# Patient Record
Sex: Female | Born: 1940
Health system: Southern US, Community
[De-identification: ages and names within clinical notes are randomized; demographics above are authoritative.]

## PROBLEM LIST (undated history)

## (undated) DIAGNOSIS — E079 Disorder of thyroid, unspecified: Secondary | ICD-10-CM

## (undated) DIAGNOSIS — I10 Essential (primary) hypertension: Secondary | ICD-10-CM

## (undated) DIAGNOSIS — E785 Hyperlipidemia, unspecified: Secondary | ICD-10-CM

## (undated) HISTORY — PX: APPENDECTOMY: SHX54

## (undated) HISTORY — DX: Hyperlipidemia, unspecified: E78.5

## (undated) HISTORY — PX: ABDOMINAL HYSTERECTOMY: SHX81

## (undated) HISTORY — PX: OTHER SURGICAL HISTORY: SHX169

## (undated) HISTORY — DX: Disorder of thyroid, unspecified: E07.9

## (undated) HISTORY — DX: Essential (primary) hypertension: I10

---

## 1999-11-17 ENCOUNTER — Other Ambulatory Visit: Admission: RE | Admit: 1999-11-17 | Discharge: 1999-11-17 | Payer: Self-pay | Admitting: Family Medicine

## 2001-04-11 ENCOUNTER — Encounter: Admission: RE | Admit: 2001-04-11 | Discharge: 2001-04-11 | Payer: Self-pay | Admitting: Family Medicine

## 2001-04-11 ENCOUNTER — Encounter: Payer: Self-pay | Admitting: Family Medicine

## 2002-01-09 ENCOUNTER — Other Ambulatory Visit: Admission: RE | Admit: 2002-01-09 | Discharge: 2002-01-09 | Payer: Self-pay | Admitting: Family Medicine

## 2002-02-04 ENCOUNTER — Encounter: Payer: Self-pay | Admitting: Ophthalmology

## 2002-02-04 ENCOUNTER — Ambulatory Visit (HOSPITAL_COMMUNITY): Admission: RE | Admit: 2002-02-04 | Discharge: 2002-02-04 | Payer: Self-pay | Admitting: Ophthalmology

## 2002-04-12 ENCOUNTER — Encounter: Payer: Self-pay | Admitting: Family Medicine

## 2002-04-12 ENCOUNTER — Encounter: Admission: RE | Admit: 2002-04-12 | Discharge: 2002-04-12 | Payer: Self-pay | Admitting: Family Medicine

## 2003-04-21 ENCOUNTER — Encounter: Admission: RE | Admit: 2003-04-21 | Discharge: 2003-04-21 | Payer: Self-pay | Admitting: Family Medicine

## 2003-04-21 ENCOUNTER — Encounter: Payer: Self-pay | Admitting: Family Medicine

## 2004-01-29 ENCOUNTER — Other Ambulatory Visit: Admission: RE | Admit: 2004-01-29 | Discharge: 2004-01-29 | Payer: Self-pay | Admitting: Family Medicine

## 2004-04-28 ENCOUNTER — Encounter: Admission: RE | Admit: 2004-04-28 | Discharge: 2004-04-28 | Payer: Self-pay | Admitting: Family Medicine

## 2005-05-11 ENCOUNTER — Encounter: Admission: RE | Admit: 2005-05-11 | Discharge: 2005-05-11 | Payer: Self-pay | Admitting: Family Medicine

## 2006-05-12 ENCOUNTER — Encounter: Admission: RE | Admit: 2006-05-12 | Discharge: 2006-05-12 | Payer: Self-pay | Admitting: Family Medicine

## 2007-01-22 ENCOUNTER — Ambulatory Visit: Payer: Self-pay | Admitting: Cardiology

## 2007-02-02 ENCOUNTER — Ambulatory Visit: Payer: Self-pay | Admitting: Internal Medicine

## 2007-02-02 LAB — CONVERTED CEMR LAB
BUN: 13 mg/dL (ref 6–23)
GFR calc non Af Amer: 89 mL/min
Glucose, Bld: 112 mg/dL — ABNORMAL HIGH (ref 70–99)
Potassium: 4.3 meq/L (ref 3.5–5.1)
Sodium: 140 meq/L (ref 135–145)

## 2007-02-08 ENCOUNTER — Ambulatory Visit (HOSPITAL_COMMUNITY): Admission: RE | Admit: 2007-02-08 | Discharge: 2007-02-08 | Payer: Self-pay | Admitting: Internal Medicine

## 2007-02-08 ENCOUNTER — Ambulatory Visit: Payer: Self-pay | Admitting: Cardiovascular Disease

## 2007-04-17 ENCOUNTER — Other Ambulatory Visit: Admission: RE | Admit: 2007-04-17 | Discharge: 2007-04-17 | Payer: Self-pay | Admitting: Family Medicine

## 2007-05-17 ENCOUNTER — Encounter: Admission: RE | Admit: 2007-05-17 | Discharge: 2007-05-17 | Payer: Self-pay | Admitting: Family Medicine

## 2008-04-22 ENCOUNTER — Encounter: Payer: Self-pay | Admitting: Emergency Medicine

## 2008-05-16 ENCOUNTER — Ambulatory Visit: Payer: Self-pay | Admitting: Internal Medicine

## 2008-05-20 ENCOUNTER — Encounter: Admission: RE | Admit: 2008-05-20 | Discharge: 2008-05-20 | Payer: Self-pay | Admitting: Family Medicine

## 2008-11-21 ENCOUNTER — Ambulatory Visit: Payer: Self-pay | Admitting: Emergency Medicine

## 2008-11-21 DIAGNOSIS — J309 Allergic rhinitis, unspecified: Secondary | ICD-10-CM | POA: Insufficient documentation

## 2008-11-21 DIAGNOSIS — E039 Hypothyroidism, unspecified: Secondary | ICD-10-CM | POA: Insufficient documentation

## 2008-11-21 DIAGNOSIS — I10 Essential (primary) hypertension: Secondary | ICD-10-CM | POA: Insufficient documentation

## 2008-11-21 DIAGNOSIS — R930 Abnormal findings on diagnostic imaging of skull and head, not elsewhere classified: Secondary | ICD-10-CM | POA: Insufficient documentation

## 2009-02-25 ENCOUNTER — Encounter (INDEPENDENT_AMBULATORY_CARE_PROVIDER_SITE_OTHER): Payer: Self-pay | Admitting: *Deleted

## 2009-05-28 ENCOUNTER — Encounter: Admission: RE | Admit: 2009-05-28 | Discharge: 2009-05-28 | Payer: Self-pay | Admitting: Family Medicine

## 2009-06-02 ENCOUNTER — Encounter: Admission: RE | Admit: 2009-06-02 | Discharge: 2009-06-02 | Payer: Self-pay | Admitting: Family Medicine

## 2010-01-26 ENCOUNTER — Telehealth: Payer: Self-pay | Admitting: Internal Medicine

## 2010-06-08 ENCOUNTER — Encounter: Admission: RE | Admit: 2010-06-08 | Discharge: 2010-06-08 | Payer: Self-pay | Admitting: Family Medicine

## 2011-01-13 NOTE — Progress Notes (Signed)
Summary: Change Practices   Phone Note Outgoing Call Call back at [facility] Phone [phone]   Call placed by: Harlow Mares CMA Duncan Dull),  January 26, 2010 9:26 AM Call placed to: Patient Summary of Call: patient called to advised she needs to schedule colonoscopy she has transfered practices to a GI in [place]. Lady Gary will put a note in IDX.  Initial call taken by: Harlow Mares CMA (AAMA),  January 26, 2010 9:26 AM

## 2011-03-07 ENCOUNTER — Ambulatory Visit: Payer: Medicare Other | Attending: Orthopaedic Surgery | Admitting: Physical Therapy

## 2011-03-07 DIAGNOSIS — IMO0001 Reserved for inherently not codable concepts without codable children: Secondary | ICD-10-CM | POA: Insufficient documentation

## 2011-03-07 DIAGNOSIS — R5381 Other malaise: Secondary | ICD-10-CM | POA: Insufficient documentation

## 2011-03-07 DIAGNOSIS — M25619 Stiffness of unspecified shoulder, not elsewhere classified: Secondary | ICD-10-CM | POA: Insufficient documentation

## 2011-03-07 DIAGNOSIS — M25519 Pain in unspecified shoulder: Secondary | ICD-10-CM | POA: Insufficient documentation

## 2011-03-11 ENCOUNTER — Ambulatory Visit: Payer: Medicare Other | Attending: Orthopaedic Surgery | Admitting: Physical Therapy

## 2011-03-11 DIAGNOSIS — R5381 Other malaise: Secondary | ICD-10-CM | POA: Insufficient documentation

## 2011-03-11 DIAGNOSIS — IMO0001 Reserved for inherently not codable concepts without codable children: Secondary | ICD-10-CM | POA: Insufficient documentation

## 2011-03-11 DIAGNOSIS — M25619 Stiffness of unspecified shoulder, not elsewhere classified: Secondary | ICD-10-CM | POA: Insufficient documentation

## 2011-03-11 DIAGNOSIS — M25519 Pain in unspecified shoulder: Secondary | ICD-10-CM | POA: Insufficient documentation

## 2011-03-14 ENCOUNTER — Ambulatory Visit: Payer: Medicare Other | Attending: Orthopaedic Surgery | Admitting: Physical Therapy

## 2011-03-14 DIAGNOSIS — R5381 Other malaise: Secondary | ICD-10-CM | POA: Insufficient documentation

## 2011-03-14 DIAGNOSIS — IMO0001 Reserved for inherently not codable concepts without codable children: Secondary | ICD-10-CM | POA: Insufficient documentation

## 2011-03-14 DIAGNOSIS — M25519 Pain in unspecified shoulder: Secondary | ICD-10-CM | POA: Insufficient documentation

## 2011-03-14 DIAGNOSIS — M25619 Stiffness of unspecified shoulder, not elsewhere classified: Secondary | ICD-10-CM | POA: Insufficient documentation

## 2011-03-25 ENCOUNTER — Ambulatory Visit: Payer: Medicare Other | Admitting: Physical Therapy

## 2011-03-29 ENCOUNTER — Ambulatory Visit: Payer: Medicare Other | Admitting: Physical Therapy

## 2011-04-01 ENCOUNTER — Ambulatory Visit: Payer: Medicare Other | Admitting: *Deleted

## 2011-04-26 NOTE — Assessment & Plan Note (Signed)
[facility]                            CARDIOLOGY OFFICE NOTE   ROSEBUD, KOENEN                        MRN:          [id number]  DATE:05/16/2008                            DOB:          04-04-1941    PRIMARY CARE Cataleah Stites:  [facility].   INTERVAL HISTORY:  Ms. Clagg is a 70 year old retired occupational  Geneticist, molecular with a history of PVCs.  There is also some question of  previous mitral valve prolapse and coronary fistula; however, she had a  cardiac CT that was normal.  There is no evidence of either.   She returns today for routine followup.  She is walking every day for 40  minutes without any problems.  Her PVCs have been eliminated with  atenolol.   The only question today is she recently had a routine chest x-ray, which  showed some apical pleural thickening.  This apparently was not evident  on her CT a year ago.  She is concerned about what this might represent.  She has not had any fevers, chills, or night sweats.   CURRENT MEDICATIONS:  1. Atenolol 50 a day.  2. Synthroid 100 a day.  3. Multivitamin.  4. Fish oil.   PHYSICAL EXAMINATION:  She is a thin woman in no acute distress,  ambulates around the clinic without any respiratory difficulty.  Blood  pressure is 120/70, heart rate is 66, weight is 132.  HEENT:  Normal.  NECK:  Supple.  There is no JVD.  Carotid are 2+ bilaterally without  bruits.  There is no lymphadenopathy or thyromegaly.  CARDIAC:  PMI is  nondisplaced, regular rate and rhythm.  No murmurs, rubs, or gallops.  No clicks.  LUNGS:  Clear.  ABDOMEN:  Soft, nontender, nondistended.  No hepatosplenomegaly.  No  bruits.  No masses.  Good bowel sounds.  EXTREMITIES: Warm with no  cyanosis, clubbing, or edema.  No rash.  NEURO:  Alert and oriented x3.  Cranial nerves II-XII are intact.  Moves  all 4 extremities without difficulty.  Affect is pleasant.   EKG shows normal sinus rhythm,  rate is 66 with no ST-T wave  abnormalities.  No extra beats.   ASSESSMENT/PLAN:  1. Premature ventricular contractions.  These have resolved with      atenolol.  2. Abnormal chest x-ray with possible apical pleural thickening.  I      have given her the option of getting a repeat CT scan or going to      see Dr. Delton Coombes in Pulmonary.  She will go and see Dr. Delton Coombes.  I      suspect this is benign.   DISPOSITION:  She is doing well.  No need for ongoing cardiac workup.  She can return as needed.     Bevelyn Buckles. Bensimhon, MD  Electronically Signed    DRB/MedQ  DD: 05/16/2008  DT: 05/16/2008  Job #: [id number]

## 2011-04-29 NOTE — Assessment & Plan Note (Signed)
[name]                            CARDIOLOGY OFFICE NOTE   [name], [name]                        MRN:          [id number]  DATE:02/02/2007                            DOB:          May 10, 1941    REFERRING PHYSICIAN:  Alfredia Client, MD   REASON FOR CONSULTATION:  Abnormal echocardiogram.   HISTORY OF PRESENT ILLNESS:  [name] is a delightful 70 year old  retired occupational Geneticist, molecular with a history of premature  ventricular beats, but no other known cardiac history, who presents  today for further evaluation of an abnormal echocardiogram.  She tells  me that, about 15 years ago, she was seen in our office for palpations.  She underwent a stress test, which was negative, and an echocardiogram  for which she was told that she had mitral valve prolapse.  She has also  been seen recently by Dr. Antoine Poche for frequent PVCs.  She has been  maintained on a beta blocker.  Recently, her dentist told her that she  would no longer need antibiotic prophylaxis regarding her mitral valve  prolapse.  Her primary physician ordered an echocardiogram to follow up  on her valve, and the echocardiogram was read by Dr. Andee Lineman, showed an  EF of 60%.  There was no evidence of significant mitral valve prolapse  or other valvular pathology.  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  She is, thus, referred for  consideration for further workup.  She is quite active at baseline.  She  walks up to 2-and-a-half miles several times a week, walks in 40  minutes.  However, she notes when she goes uphill, she does get a bit  short of breath with this.  She denies any chest pain.  No heart failure  symptoms.   REVIEW OF SYSTEMS:  Negative for syncope or pre-syncope.  She does have  a history of high blood pressure.  No melena, no fevers, no chills, no  bright red blood per rectum.  She does have a history of allergies as  well.  The remainder of the review of systems is negative, except for  HPI and problem list.   PAST MEDICAL HISTORY:  1. History of premature ventricular contractions.  2. Hypertension, treated.  3. Hypothyroidism.  4. Allergies/hay fever.   CURRENT MEDICATIONS:  1. Atenolol 15 mg a day.  2. Synthroid 100 mcg a day.  3. Multivitamin.   SOCIAL HISTORY:  She is a retired Conservation officer, nature.  She is married  with 3 kids.  She does not smoke.  She does have 1 glass of red wine a  day.   ALLERGIES:  PENICILLIN, SULFA.   FAMILY HISTORY:  Mother lived to almost 49.  Died of a heart attack.  Father died at 70 due to pneumonia.  A brother died at age 56 due to  diphtheria.   PHYSICAL EXAM:  She is a well-appearing woman in no acute distress.  Ambulates around the clinic without any respiratory difficulty.  Blood pressure is 132/88, heart rate is 74, weight  129.  HEENT:  Sclerae anicteric.  EOMI.  There is no xanthelasma.  Mucous  membranes are moist.  Oropharynx is clear.  No JVD.  Carotid are 2+ bilaterally without any bruits.  There is no  lymphadenopathy or thyromegaly.  CARDIAC:  She has a regular rate and rhythm with an S4.  No murmur.  There is no bruit over her subclavians or over her back.  LUNGS:  Clear.  ABDOMEN:  Soft, nontender, nondistended.  No hepatosplenomegaly.  No  bruits.  No masses.  Good bowel sounds.  EXTREMITIES:  Warm with no cyanosis, clubbing, or edema.  No rash.  NEUROLOGIC:  She is alert and oriented x3.  Cranial nerves 2-12 are  intact.  Moves all 4 extremities without difficulty.  Affect is  pleasant.   ELECTROCARDIOGRAM:  Normal sinus rhythm at a rate of 68 with no ST-T  wave abnormalities.  There is no preexcitation and there are normal  intervals.   ASSESSMENT AND PLAN:  1. Abnormal echocardiogram.  According to the report form her      echocardiogram, there is some question of an abnormal flow      originating from the pulmonary artery, perhaps  coronary fistula.      There is even some comment of a possible patent ductus arteriosus.      However, on my exam there is no bruit there.  I do think it is      reasonable to proceed with a cardiac CT to further evaluate.  2. Dyspnea.  Her exercise tolerance is pretty good.  She does have      some dyspnea on hills.  I think this is normal.  She may have some      component of diastolic dyspnea, though this was not mentioned on      her echocardiogram.  We will be able to look at her coronaries with      the cardiac CT.  3. Hypertension.  Blood pressure is mildly elevated.  I will leave      this to her primary care physician to address.   DISPOSITION:  Pending the results of her cardiac CT.     Bevelyn Buckles. Bensimhon, MD  Electronically Signed    DRB/MedQ  DD: 02/02/2007  DT: 02/02/2007  Job #: [id number]

## 2011-05-20 ENCOUNTER — Other Ambulatory Visit: Payer: Self-pay | Admitting: Family Medicine

## 2011-05-20 DIAGNOSIS — Z1231 Encounter for screening mammogram for malignant neoplasm of breast: Secondary | ICD-10-CM

## 2011-06-28 ENCOUNTER — Ambulatory Visit
Admission: RE | Admit: 2011-06-28 | Discharge: 2011-06-28 | Disposition: A | Discharge status: Home / Self Care | Payer: Medicare Other | Source: Ambulatory Visit | Attending: Family Medicine | Admitting: Family Medicine

## 2011-06-28 DIAGNOSIS — Z1231 Encounter for screening mammogram for malignant neoplasm of breast: Secondary | ICD-10-CM

## 2012-05-22 ENCOUNTER — Other Ambulatory Visit: Payer: Self-pay | Admitting: Family Medicine

## 2012-05-22 DIAGNOSIS — Z1231 Encounter for screening mammogram for malignant neoplasm of breast: Secondary | ICD-10-CM

## 2012-06-28 ENCOUNTER — Ambulatory Visit
Admission: RE | Admit: 2012-06-28 | Discharge: 2012-06-28 | Disposition: A | Discharge status: Home / Self Care | Payer: Medicare Other | Source: Ambulatory Visit | Attending: Family Medicine | Admitting: Family Medicine

## 2012-06-28 ENCOUNTER — Ambulatory Visit: Payer: Medicare Other

## 2012-06-28 DIAGNOSIS — Z1231 Encounter for screening mammogram for malignant neoplasm of breast: Secondary | ICD-10-CM

## 2013-05-16 ENCOUNTER — Telehealth: Payer: Self-pay | Admitting: Family Medicine

## 2013-06-11 ENCOUNTER — Other Ambulatory Visit: Payer: Self-pay

## 2013-06-11 DIAGNOSIS — Z1231 Encounter for screening mammogram for malignant neoplasm of breast: Secondary | ICD-10-CM

## 2013-07-04 ENCOUNTER — Ambulatory Visit (INDEPENDENT_AMBULATORY_CARE_PROVIDER_SITE_OTHER): Payer: Medicare Other | Admitting: Family Medicine

## 2013-07-04 ENCOUNTER — Encounter: Payer: Self-pay | Admitting: Family Medicine

## 2013-07-04 VITALS — BP 145/72 | HR 70 | Temp 97.3°F | Ht 64.0 in | Wt 132.0 lb

## 2013-07-04 DIAGNOSIS — I10 Essential (primary) hypertension: Secondary | ICD-10-CM

## 2013-07-04 DIAGNOSIS — Z0000 Encounter for general adult medical examination without abnormal findings: Secondary | ICD-10-CM | POA: Insufficient documentation

## 2013-07-04 DIAGNOSIS — G4700 Insomnia, unspecified: Secondary | ICD-10-CM

## 2013-07-04 DIAGNOSIS — E039 Hypothyroidism, unspecified: Secondary | ICD-10-CM

## 2013-07-04 DIAGNOSIS — E785 Hyperlipidemia, unspecified: Secondary | ICD-10-CM

## 2013-07-04 LAB — POCT CBC
Granulocyte percent: 72.1 %G (ref 37–80)
HCT, POC: 45.6 % (ref 37.7–47.9)
Hemoglobin: 15.6 g/dL (ref 12.2–16.2)
Lymph, poc: 1.9 (ref 0.6–3.4)
MCH, POC: 31 pg (ref 27–31.2)
MCHC: 34.2 g/dL (ref 31.8–35.4)
MCV: 90.7 fL (ref 80–97)
MPV: 6.7 fL (ref 0–99.8)
POC Granulocyte: 5.6 (ref 2–6.9)
POC LYMPH PERCENT: 25 %L (ref 10–50)
Platelet Count, POC: 241 10*3/uL (ref 142–424)
RBC: 5 M/uL (ref 4.04–5.48)
RDW, POC: 13 %
WBC: 7.7 10*3/uL (ref 4.6–10.2)

## 2013-07-04 MED ORDER — ATENOLOL 50 MG PO TABS: 50.0000 mg | ORAL_TABLET | Freq: Every day | ORAL | Status: DC

## 2013-07-04 MED ORDER — LEVOTHYROXINE SODIUM 100 MCG PO TABS: 100.0000 ug | ORAL_TABLET | Freq: Every day | ORAL | Status: DC

## 2013-07-04 MED ORDER — ESZOPICLONE 2 MG PO TABS: 2.0000 mg | ORAL_TABLET | Freq: Every day | ORAL | Status: DC

## 2013-07-04 NOTE — Patient Instructions (Addendum)
HEALTH MAINTENANCE Immunizations: Tetanus-Diphtheria Booster due:2016 Pertusis Booster due:2016 Flu Shot Due: every Fall Pneumonia Vaccine: usually at 73 years of age unless there are certain risk situations. given 05/2009 Herpes Zoster/Shingles Vaccine due: usually at 73 years of age. Given 05/25/2010 HPV YQM:VHQI age 58 to 31 years in males and females.  Healthy Life Habits: Exercise Goal: 5-6 days/week; start gradually(ie 30 minutes/3days per week) Nutrition: Balanced healthy meals including Vegetables and Fruits. Consider  Reading the following books: 1) Eat to Live by Dr Ottis Stain; 2) Prevent and Reverse Heart Disease by Dr Suzzette Righter.  Vitamins:multivitamin and B12 tabs is good Aspirin:n/a due to easy bleeding Stop Tobacco Use:n/a Seat Belt Use:+++ recommended Sunscreen Use:+++ recommended Osteoporosis Prevention: 1) Exercise                                            2)Calcium/Vitamin D requirements:he [employer] of [employer] recommends:    Calcium:  800 mg/day for children 84-61 years of age          73 mg/day for children 59-27 years of age          73 mg/day for adults 37-34 years of age          73 mg/day for everyone more than 73 years of age     Vitamin D: 800 IU per day or as prescribed if you are deficient.  Recommended Screening Tests: Colon Cancer Screening:2020 Blood work: today Cholesterol Screening:     +++      HIV:   n/a                Hepatitis C(people born 1945-1965):n/a  Mammogram:next Monday DEXA/Bone Density:2017 GYN Exam:today Monthly Self Breast Exam:+++  Eye Exam: every 1 to 2 years recommended; done in January. Dental Health: at least every 6 months: Dr Melvyn Neth  Others:    Living Will/Healthcare Power of Attorney: should have this in order with your personal estate planning

## 2013-07-04 NOTE — Progress Notes (Signed)
Patient ID: [name], female   DOB: 06-04-1941, 72 y.o.   MRN: [id number] SUBJECTIVE: CC: Chief Complaint  Patient presents with  . Annual Exam    NO COMPLAINTS    HPI: 1) annual physical: doing well  2) Patient is here for follow up of hyperlipidemia: denies Headache;denies Chest Pain;denies weakness;denies Shortness of Breath and orthopnea;denies Visual changes;denies palpitations;denies cough;denies pedal edema;denies symptoms of TIA or stroke;deniesClaudication symptoms. admits to Compliance with medications; Problems with medications.fenofibrate caused diarrhea; statin caused problems with shoulder pains.    3)Patient is here for follow up of hypertension: BPs are normal at home , patient brought readings: 110/70 to 120/70 denies Headache;deniesChest Pain;denies weakness;denies Shortness of Breath or Orthopnea;denies Visual changes;denies palpitations;denies cough;denies pedal edema;denies symptoms of TIA or stroke; admits to Compliance with medications. denies Problems with medications.  Past Medical History  Diagnosis Date  . Thyroid disease   . Hypertension   . Hyperlipidemia    Past Surgical History  Procedure Laterality Date  . Abdominal hysterectomy    . Appendectomy    . Tosillectomy     History   Social History  . Marital Status: Married    Spouse Name: N/A    Number of Children: N/A  . Years of Education: N/A   Occupational History  . Not on file.   Social History Main Topics  . Smoking status: Never Smoker   . Smokeless tobacco: Not on file  . Alcohol Use: Not on file  . Drug Use: Not on file  . Sexually Active: Not on file   Other Topics Concern  . Not on file   Social History Narrative  . No narrative on file   No family history on file. No current outpatient prescriptions on file prior to visit.   No current facility-administered medications on file prior to visit.   Allergies  Allergen Reactions  . Penicillins   . Sulfonamide  Derivatives     There is no immunization history on file for this patient. Prior to Admission medications   Medication Sig Start Date End Date Taking? Authorizing Provider  atenolol (TENORMIN) 50 MG tablet Take 1 tablet (50 mg total) by mouth daily. 07/04/13  Yes Ileana Ladd, MD  Cholecalciferol (VITAMIN D-3) 1000 UNITS CAPS Take 1 capsule by mouth daily.   Yes Historical Provider, MD  Coenzyme Q10 (CO Q 10) 100 MG CAPS Take 1 capsule by mouth daily.   Yes Historical Provider, MD  eszopiclone (LUNESTA) 2 MG TABS Take 1 tablet (2 mg total) by mouth at bedtime. Take immediately before bedtime 07/04/13  Yes Ileana Ladd, MD  fish oil-omega-3 fatty acids 1000 MG capsule Take 1 g by mouth.   Yes Historical Provider, MD  Glucosamine HCl 1000 MG TABS Take 1 tablet by mouth daily.   Yes Historical Provider, MD  levothyroxine (SYNTHROID, LEVOTHROID) 100 MCG tablet Take 1 tablet (100 mcg total) by mouth daily before breakfast. 07/04/13  Yes Ileana Ladd, MD  multivitamin-lutein [facility]) CAPS Take 1 capsule by mouth daily.   Yes Historical Provider, MD  Probiotic Product (SOLUBLE FIBER/PROBIOTICS PO) Take by mouth.   Yes Historical Provider, MD  vitamin E 400 UNIT capsule Take 400 Units by mouth daily.   Yes Historical Provider, MD   ROS: As above in the HPI. All other systems are stable or negative.  Results for orders placed in visit on 07/04/13  POCT CBC      Result Value Range   WBC 7.7  4.6 -  10.2 K/uL   Lymph, poc 1.9  0.6 - 3.4   POC LYMPH PERCENT 25.0  10 - 50 %L   POC Granulocyte 5.6  2 - 6.9   Granulocyte percent 72.1  37 - 80 %G   RBC 5.0  4.04 - 5.48 M/uL   Hemoglobin 15.6  12.2 - 16.2 g/dL   HCT, POC 14.7  82.9 - 47.9 %   MCV 90.7  80 - 97 fL   MCH, POC 31.0  27 - 31.2 pg   MCHC 34.2  31.8 - 35.4 g/dL   RDW, POC 56.2     Platelet Count, POC 241.0  142 - 424 K/uL   MPV 6.7  0 - 99.8 fL    OBJECTIVE: APPEARANCE:  Patient in no acute distress.The patient appeared  well nourished and normally developed. Acyanotic. Waist: VITAL SIGNS:BP 145/72  Pulse 70  Temp(Src) 97.3 F (36.3 C) (Oral)  Ht 5\' 4"  (1.626 m)  Wt 132 lb (59.875 kg)  BMI 22.65 kg/m2  WF NAD  SKIN: warm and  Dry without overt rashes, tattoos and scars  HEAD and Neck: without JVD, Head and scalp: normal Eyes:No scleral icterus. Fundi normal, eye movements normal. Ears: Auricle normal, canal normal, Tympanic membranes normal, insufflation normal. Nose: normal Throat: normal Neck & thyroid: normal  CHEST & LUNGS: Chest wall: normal Lungs: Clear   Breast Exam:  Appearance:  Skin : normal Areolas normal Nipples normal Dimples:none Palpation: Normal Masses: none Lumps:none  Lymph Nodes: Axillary: Normal  CVS: Reveals the PMI to be normally located. Regular rhythm, First and Second Heart sounds are normal,  absence of murmurs, rubs or gallops. Peripheral vasculature: Radial pulses: normal Dorsal pedis pulses: normal Posterior pulses: normal  ABDOMEN:  Appearance: normal Benign, no organomegaly, no masses, no Abdominal Aortic enlargement. No Guarding , no rebound. No Bruits. Bowel sounds: normal  RECTAL: N/A GU: N/A  EXTREMETIES: nonedematous. Both Femoral and Pedal pulses are normal.  MUSCULOSKELETAL:  Spine: normal Joints: intact  NEUROLOGIC: oriented to time,place and person; nonfocal. Strength is normal Sensory is normal Reflexes are normal Cranial Nerves are normal.  ASSESSMENT: Annual physical exam - Plan: EKG 12-Lead, POCT CBC  HYPOTHYROIDISM - Plan: levothyroxine (SYNTHROID, LEVOTHROID) 100 MCG tablet, TSH  HYPERTENSION - Plan: atenolol (TENORMIN) 50 MG tablet, NMR Lipoprofile with Lipids, COMPLETE METABOLIC PANEL WITH GFR  Insomnia - Plan: eszopiclone (LUNESTA) 2 MG TABS  HLD (hyperlipidemia) - Plan: NMR Lipoprofile with Lipids, COMPLETE METABOLIC PANEL WITH GFR  PLAN:       HEALTH MAINTENANCE Immunizations: Tetanus-Diphtheria  Booster due:2016 Pertusis Booster due:2016 Flu Shot Due: every Fall Pneumonia Vaccine: usually at 73 years of age unless there are certain risk situations.given 05/2009 Herpes Zoster/Shingles Vaccine due: usually at 73 years of age. Given 05/25/2010 HPV ZHY:QMVH age 38 to 76 years in males and females.  Healthy Life Habits: Exercise Goal: 5-6 days/week; start gradually(ie 30 minutes/3days per week) Nutrition: Balanced healthy meals including Vegetables and Fruits. Consider  Reading the following books: 1) Eat to Live by Dr Ottis Stain; 2) Prevent and Reverse Heart Disease by Dr Suzzette Righter.  Vitamins:multivitamin and B12 tabs is good Aspirin: Stop Tobacco Use:n/a Seat Belt Use:+++ recommended Sunscreen Use:+++ recommended Osteoporosis Prevention: 1) Exercise                                            2)Calcium/Vitamin D  requirements:he [employer] of the [employer] recommends:    Calcium:  800 mg/day for children 27-40 years of age          73 mg/day for children 32-31 years of age          73 mg/day for adults 27-59 years of age          73 mg/day for everyone more than 73 years of age     Vitamin D: 800 IU per day or as prescribed if you are deficient.  Recommended Screening Tests: Colon Cancer Screening:2020 Blood work: today Cholesterol Screening:     +++      HIV:   n/a                Hepatitis C(people born 1945-1965):n/a  Mammogram:next Monday DEXA/Bone Density:2017 GYN Exam:today Monthly Self Breast Exam:+++  Eye Exam: every 1 to 2 years recommended; done in January. Dental Health: at least every 6 months: Dr Melvyn Neth  Others:    Living Will/Healthcare Power of Attorney: should have this in order with your personal estate planning

## 2013-07-05 LAB — NMR LIPOPROFILE WITH LIPIDS
Cholesterol, Total: 222 mg/dL — ABNORMAL HIGH (ref <200)
HDL Particle Number: 31.2 umol/L (ref >=30.5)
HDL Size: 8.8 nm — ABNORMAL LOW (ref >=9.2)
HDL-C: 48 mg/dL (ref >=40)
LDL (calc): 141 mg/dL — ABNORMAL HIGH (ref <100)
LDL Particle Number: 2363 nmol/L — ABNORMAL HIGH (ref <1000)
LDL Size: 20 nm — ABNORMAL LOW (ref >20.5)
LP-IR Score: 46 — ABNORMAL HIGH (ref <=45)
Large HDL-P: 3.4 umol/L — ABNORMAL LOW (ref >=4.8)
Large VLDL-P: <0.8 nmol/L (ref <=2.7)
Small LDL Particle Number: 1577 nmol/L — ABNORMAL HIGH (ref <=527)
Triglycerides: 163 mg/dL — ABNORMAL HIGH (ref <150)
VLDL Size: 48.4 nm — ABNORMAL HIGH (ref <=46.6)

## 2013-07-05 LAB — COMPLETE METABOLIC PANEL WITH GFR
ALT: 15 U/L (ref 0–35)
AST: 18 U/L (ref 0–37)
Albumin: 4.1 g/dL (ref 3.5–5.2)
Alkaline Phosphatase: 50 U/L (ref 39–117)
BUN: 12 mg/dL (ref 6–23)
CO2: 28 mEq/L (ref 19–32)
Calcium: 9.7 mg/dL (ref 8.4–10.5)
Chloride: 101 mEq/L (ref 96–112)
Creat: 0.68 mg/dL (ref 0.50–1.10)
GFR, Est African American: >89 mL/min
GFR, Est Non African American: 87 mL/min
Glucose, Bld: 102 mg/dL — ABNORMAL HIGH (ref 70–99)
Potassium: 5.3 mEq/L (ref 3.5–5.3)
Sodium: 138 mEq/L (ref 135–145)
Total Bilirubin: 0.8 mg/dL (ref 0.3–1.2)
Total Protein: 7 g/dL (ref 6.0–8.3)

## 2013-07-05 LAB — TSH: TSH: 1.652 u[IU]/mL (ref 0.350–4.500)

## 2013-07-08 ENCOUNTER — Other Ambulatory Visit: Payer: Self-pay | Admitting: Family Medicine

## 2013-07-08 ENCOUNTER — Ambulatory Visit: Payer: Medicare Other

## 2013-07-08 DIAGNOSIS — E785 Hyperlipidemia, unspecified: Secondary | ICD-10-CM

## 2013-07-08 MED ORDER — PRAVASTATIN SODIUM 20 MG PO TABS: 20.0000 mg | ORAL_TABLET | Freq: Every day | ORAL | Status: DC

## 2013-07-17 ENCOUNTER — Other Ambulatory Visit: Payer: Self-pay

## 2013-07-24 ENCOUNTER — Ambulatory Visit
Admission: RE | Admit: 2013-07-24 | Discharge: 2013-07-24 | Disposition: A | Discharge status: Home / Self Care | Payer: Medicare Other | Source: Ambulatory Visit

## 2013-07-24 DIAGNOSIS — Z1231 Encounter for screening mammogram for malignant neoplasm of breast: Secondary | ICD-10-CM

## 2013-08-19 ENCOUNTER — Ambulatory Visit (INDEPENDENT_AMBULATORY_CARE_PROVIDER_SITE_OTHER): Payer: Medicare Other | Admitting: Pharmacist

## 2013-08-19 VITALS — BP 140/70 | HR 70 | Ht 64.0 in | Wt 133.5 lb

## 2013-08-19 DIAGNOSIS — E039 Hypothyroidism, unspecified: Secondary | ICD-10-CM

## 2013-08-19 DIAGNOSIS — E785 Hyperlipidemia, unspecified: Secondary | ICD-10-CM

## 2013-08-19 DIAGNOSIS — I10 Essential (primary) hypertension: Secondary | ICD-10-CM

## 2013-08-19 MED ORDER — LEVOTHYROXINE SODIUM 100 MCG PO TABS: 100.0000 ug | ORAL_TABLET | Freq: Every day | ORAL | Status: DC

## 2013-08-19 MED ORDER — ATENOLOL 50 MG PO TABS: 50.0000 mg | ORAL_TABLET | Freq: Every day | ORAL | Status: DC

## 2013-08-19 NOTE — Progress Notes (Signed)
Lipid Clinic Consultation  Chief Complaint:   Chief Complaint  Patient presents with  . Hyperlipidemia    HPI:  Has tries multiple statins with adverse effects.   Pravastatin - frozen shoulder, fenofibrate - GI problems, livalo - myalgias (even tried MWF), niaspan - severe flushing, Crestor - myalgias, Red Yeast Rice - myalgias Filed Vitals:   08/19/13 1614  BP: 140/70  Pulse: 70   Filed Weights   08/19/13 1614  Weight: 133 lb 8 oz (60.555 kg)    General Appearance:  alert, oriented, no acute distress and well nourished Mood/Affect:  normal  Lipid Panel - 07/2013 LDL-P = 2363 Tg = 163 HDL = 48 TC= 222  Assessment: CHD/CHF Risk Equivalents:  none AHA CHD risk over next 10 years = 20.8%  Primary Problem(s):  LDL or LDL-P elevated and TG elevated  Current NCEP Goals: LDL Goal < 100 HDL Goal >/= 45 Tg Goal < 161 Non-HDL Goal < 130  Secondary cause of hyperlipidemia present:  none Low fat diet followed?  Yes - tried vegan diet - experienced extreme fatigue, currently eats occassional red meat - mostly grilled chicken or fish, lots of fruits and vegetables.   Eats walnuts, almonds, flax seed powder and chia seed.  Low carb diet followed?  Yes -   Exercise?  Yes -    Assessment - hyperlipidemia with greater than 7.5% risk of CHD but intolerant to statins  Recommendations: 1.  Start Zetia 10mg  daily (in future may retry low dose statin though moderate dose would abe ideal) 2. Reviewed healthy diet principles like limiting serving sizes of high fat and high CHO foods though patient eats a pretty healthy diet 3.  Continue regular exercise.   Recheck Lipid Panel:  3 months

## 2013-08-30 ENCOUNTER — Telehealth: Payer: Self-pay | Admitting: Pharmacist

## 2013-09-02 NOTE — Telephone Encounter (Signed)
Patient states that she started Zetia.   Tolerating Zetia well.  Will be out of town for 1 month and would like samples until she returns.   #35 samples left at front desk.

## 2013-10-17 ENCOUNTER — Other Ambulatory Visit: Payer: Self-pay

## 2014-01-07 ENCOUNTER — Ambulatory Visit (INDEPENDENT_AMBULATORY_CARE_PROVIDER_SITE_OTHER): Payer: Medicare HMO | Admitting: Family Medicine

## 2014-01-07 ENCOUNTER — Encounter: Payer: Self-pay | Admitting: Family Medicine

## 2014-01-07 ENCOUNTER — Telehealth: Payer: Self-pay | Admitting: Family Medicine

## 2014-01-07 VITALS — BP 142/71 | HR 70 | Temp 97.8°F | Ht 64.0 in | Wt 135.0 lb

## 2014-01-07 DIAGNOSIS — E785 Hyperlipidemia, unspecified: Secondary | ICD-10-CM

## 2014-01-07 DIAGNOSIS — I10 Essential (primary) hypertension: Secondary | ICD-10-CM

## 2014-01-07 DIAGNOSIS — J309 Allergic rhinitis, unspecified: Secondary | ICD-10-CM

## 2014-01-07 DIAGNOSIS — E039 Hypothyroidism, unspecified: Secondary | ICD-10-CM

## 2014-01-07 MED ORDER — LEVOTHYROXINE SODIUM 100 MCG PO TABS: 100.0000 ug | ORAL_TABLET | Freq: Every day | ORAL | Status: DC

## 2014-01-07 MED ORDER — ATENOLOL 50 MG PO TABS: 50.0000 mg | ORAL_TABLET | Freq: Every day | ORAL | Status: DC

## 2014-01-07 MED ORDER — COLESEVELAM HCL 3.75 G PO PACK: 3.7500 g | PACK | Freq: Every day | ORAL | Status: DC

## 2014-01-07 NOTE — Progress Notes (Signed)
Patient ID: [name], female   DOB: 17-Nov-1941, 73 y.o.   MRN: [id number] SUBJECTIVE: CC: Chief Complaint  Patient presents with  . Follow-up    6 month follow up     HPI: Patient is here for follow up of hyperlipidemia/HTN/hypothyroid: denies Headache;denies Chest Pain;denies weakness;denies Shortness of Breath and orthopnea;denies Visual changes;denies palpitations;denies cough;denies pedal edema;denies symptoms of TIA or stroke;deniesClaudication symptoms. admits to Compliance with medications; denies Problems with medications.    Past Medical History  Diagnosis Date  . Thyroid disease   . Hypertension   . Hyperlipidemia    Past Surgical History  Procedure Laterality Date  . Abdominal hysterectomy    . Appendectomy    . Tosillectomy     History   Social History  . Marital Status: Married    Spouse Name: N/A    Number of Children: N/A  . Years of Education: N/A   Occupational History  . Not on file.   Social History Main Topics  . Smoking status: Never Smoker   . Smokeless tobacco: Not on file  . Alcohol Use: Not on file  . Drug Use: Not on file  . Sexual Activity: Not on file   Other Topics Concern  . Not on file   Social History Narrative  . No narrative on file   No family history on file. Current Outpatient Prescriptions on File Prior to Visit  Medication Sig Dispense Refill  . Cholecalciferol (VITAMIN D-3) 1000 UNITS CAPS Take 1 capsule by mouth daily.      . Coenzyme Q10 (CO Q 10) 100 MG CAPS Take 1 capsule by mouth daily.      . eszopiclone (LUNESTA) 2 MG TABS Take 1 tablet (2 mg total) by mouth at bedtime. Take immediately before bedtime  30 tablet  2  . fish oil-omega-3 fatty acids 1000 MG capsule Take 1 g by mouth.      . Glucosamine HCl 1000 MG TABS Take 1 tablet by mouth daily.      . multivitamin-lutein (OCUVITE-LUTEIN) CAPS Take 1 capsule by mouth daily.      . Probiotic Product (SOLUBLE FIBER/PROBIOTICS PO) Take by mouth.      .  vitamin E 400 UNIT capsule Take 400 Units by mouth daily.       No current facility-administered medications on file prior to visit.   Allergies  Allergen Reactions  . Penicillins   . Statins     Muscle problems   . Sulfonamide Derivatives    Immunization History  Administered Date(s) Administered  . Pneumococcal-Unspecified 05/12/2009  . Tetanus 01/12/2005  . Zoster 05/25/2010   Prior to Admission medications   Medication Sig Start Date End Date Taking? Authorizing Provider  atenolol (TENORMIN) 50 MG tablet Take 1 tablet (50 mg total) by mouth daily. 08/19/13   Vernie Shanks, MD  Cholecalciferol (VITAMIN D-3) 1000 UNITS CAPS Take 1 capsule by mouth daily.    Historical Provider, MD  Coenzyme Q10 (CO Q 10) 100 MG CAPS Take 1 capsule by mouth daily.    Historical Provider, MD  eszopiclone (LUNESTA) 2 MG TABS Take 1 tablet (2 mg total) by mouth at bedtime. Take immediately before bedtime 07/04/13   Vernie Shanks, MD  fish oil-omega-3 fatty acids 1000 MG capsule Take 1 g by mouth.    Historical Provider, MD  Glucosamine HCl 1000 MG TABS Take 1 tablet by mouth daily.    Historical Provider, MD  levothyroxine (SYNTHROID, LEVOTHROID) 100 MCG tablet Take 1  tablet (100 mcg total) by mouth daily before breakfast. 08/19/13   Vernie Shanks, MD  multivitamin-lutein [facility]) CAPS Take 1 capsule by mouth daily.    Historical Provider, MD  Probiotic Product (SOLUBLE FIBER/PROBIOTICS PO) Take by mouth.    Historical Provider, MD  vitamin E 400 UNIT capsule Take 400 Units by mouth daily.    Historical Provider, MD     ROS: As above in the HPI. All other systems are stable or negative.  OBJECTIVE: APPEARANCE:  Patient in no acute distress.The patient appeared well nourished and normally developed. Acyanotic. Waist: VITAL SIGNS:BP 142/71  Pulse 70  Temp(Src) 97.8 F (36.6 C) (Oral)  Ht $R'5\' 4"'oI$  (1.626 m)  Wt 135 lb (61.236 kg)  BMI 23.16 kg/m2  WF slim  SKIN: warm and  Dry without  overt rashes, tattoos and scars  HEAD and Neck: without JVD, Head and scalp: normal Eyes:No scleral icterus. Fundi normal, eye movements normal. Ears: Auricle normal, canal normal, Tympanic membranes normal, insufflation normal. Nose: normal Throat: normal Neck & thyroid: normal  CHEST & LUNGS: Chest wall: normal Lungs: Clear  CVS: Reveals the PMI to be normally located. Regular rhythm, First and Second Heart sounds are normal,  absence of murmurs, rubs or gallops. Peripheral vasculature: Radial pulses: normal Dorsal pedis pulses: normal Posterior pulses: normal  ABDOMEN:  Appearance: normal Benign, no organomegaly, no masses, no Abdominal Aortic enlargement. No Guarding , no rebound. No Bruits. Bowel sounds: normal  RECTAL: N/A GU: N/A  EXTREMETIES: nonedematous.  MUSCULOSKELETAL:  Spine: normal Joints: intact  NEUROLOGIC: oriented to time,place and person; nonfocal. Strength is normal Sensory is normal Reflexes are normal Cranial Nerves are normal.  Results for orders placed in visit on 07/04/13  NMR LIPOPROFILE WITH LIPIDS      Result Value Range   LDL Particle Number [id number] (*) <1000 nmol/L   LDL (calc) 141 (*) <100 mg/dL   HDL-C 48  >=40 mg/dL   Triglycerides 163 (*) <150 mg/dL   Cholesterol, Total 222 (*) <200 mg/dL   HDL Particle Number [id number]  >=30.5 umol/L   Large HDL-P 3.4 (*) >=4.8 umol/L   Large VLDL-P <0.8  <=2.7 nmol/L   Small LDL Particle Number [id number] (*) <=527 nmol/L   LDL Size 20.0 (*) >20.5 nm   HDL Size 8.8 (*) >=9.2 nm   VLDL Size 48.4 (*) <=46.6 nm   LP-IR Score 46 (*) <=45  COMPLETE METABOLIC PANEL WITH GFR      Result Value Range   Sodium 138  135 - 145 mEq/L   Potassium 5.3  3.5 - 5.3 mEq/L   Chloride 101  96 - 112 mEq/L   CO2 28  19 - 32 mEq/L   Glucose, Bld 102 (*) 70 - 99 mg/dL   BUN 12  6 - 23 mg/dL   Creat 0.68  0.50 - 1.10 mg/dL   Total Bilirubin 0.8  0.3 - 1.2 mg/dL   Alkaline Phosphatase 50  39 - 117 U/L   AST 18  0 -  37 U/L   ALT 15  0 - 35 U/L   Total Protein 7.0  6.0 - 8.3 g/dL   Albumin 4.1  3.5 - 5.2 g/dL   Calcium 9.7  8.4 - 10.5 mg/dL   GFR, Est African American >89     GFR, Est Non African American 87    TSH      Result Value Range   TSH 1.652  0.350 - 4.500 uIU/mL  POCT CBC  Result Value Range   WBC 7.7  4.6 - 10.2 K/uL   Lymph, poc 1.9  0.6 - 3.4   POC LYMPH PERCENT 25.0  10 - 50 %L   POC Granulocyte 5.6  2 - 6.9   Granulocyte percent 72.1  37 - 80 %G   RBC 5.0  4.04 - 5.48 M/uL   Hemoglobin 15.6  12.2 - 16.2 g/dL   HCT, POC 45.6  37.7 - 47.9 %   MCV 90.7  80 - 97 fL   MCH, POC 31.0  27 - 31.2 pg   MCHC 34.2  31.8 - 35.4 g/dL   RDW, POC 13.0     Platelet Count, POC 241.0  142 - 424 K/uL   MPV 6.7  0 - 99.8 fL     ASSESSMENT: Hyperlipidemia - Plan: Colesevelam HCl 3.75 G PACK, CMP14+EGFR, NMR, lipoprofile  HYPERTENSION - Plan: atenolol (TENORMIN) 50 MG tablet  HYPOTHYROIDISM - Plan: TSH, levothyroxine (SYNTHROID, LEVOTHROID) 100 MCG tablet  ALLERGIC RHINITIS  PLAN:      Dr Paula Libra Recommendations  For nutrition information, I recommend books:  1).Eat to Live by Dr Excell Seltzer. 2).Prevent and Reverse Heart Disease by Dr Karl Luke. 3) Dr Janene Harvey Book:  Program to Reverse Diabetes  Exercise recommendations are:  If unable to walk, then the patient can exercise in a chair 3 times a day. By flapping arms like a bird gently and raising legs outwards to the front.  If ambulatory, the patient can go for walks for 30 minutes 3 times a week. Then increase the intensity and duration as tolerated.  Goal is to try to attain exercise frequency to 5 times a week.  If applicable: Best to perform resistance exercises (machines or weights) 2 days a week and cardio type exercises 3 days per week.  Orders Placed This Encounter  Procedures  . CMP14+EGFR  . NMR, lipoprofile  . TSH   Meds ordered this encounter  Medications  . Colesevelam HCl 3.75 G  PACK    Sig: Take 1 packet (3.8 g total) by mouth daily.    Dispense:  90 each    Refill:  3  . atenolol (TENORMIN) 50 MG tablet    Sig: Take 1 tablet (50 mg total) by mouth daily.    Dispense:  90 tablet    Refill:  3    Patient wants 90 day supply.  Marland Kitchen levothyroxine (SYNTHROID, LEVOTHROID) 100 MCG tablet    Sig: Take 1 tablet (100 mcg total) by mouth daily before breakfast.    Dispense:  90 tablet    Refill:  3    Patient requesting 90 day supply   Medications Discontinued During This Encounter  Medication Reason  . atenolol (TENORMIN) 50 MG tablet Reorder  . levothyroxine (SYNTHROID, LEVOTHROID) 100 MCG tablet Reorder   Return in about 6 months (around 07/07/2014) for Recheck medical problems.  Wylie Russon P. Jacelyn Grip, M.D.

## 2014-01-07 NOTE — Patient Instructions (Signed)
      Dr Rosaisela Jamroz's Recommendations  For nutrition information, I recommend books:  1).Eat to Live by Dr Joel Fuhrman. 2).Prevent and Reverse Heart Disease by Dr Caldwell Esselstyn. 3) Dr Neal Barnard's Book:  Program to Reverse Diabetes  Exercise recommendations are:  If unable to walk, then the patient can exercise in a chair 3 times a day. By flapping arms like a bird gently and raising legs outwards to the front.  If ambulatory, the patient can go for walks for 30 minutes 3 times a week. Then increase the intensity and duration as tolerated.  Goal is to try to attain exercise frequency to 5 times a week.  If applicable: Best to perform resistance exercises (machines or weights) 2 days a week and cardio type exercises 3 days per week.  

## 2014-01-08 LAB — SPECIMEN STATUS REPORT

## 2014-01-10 LAB — LIPID PANEL
Chol/HDL Ratio: 4.5 ratio units — ABNORMAL HIGH (ref 0.0–4.4)
Cholesterol, Total: 230 mg/dL — ABNORMAL HIGH (ref 100–199)
HDL: 51 mg/dL (ref >39)
LDL Calculated: 147 mg/dL — ABNORMAL HIGH (ref 0–99)
Triglycerides: 158 mg/dL — ABNORMAL HIGH (ref 0–149)
VLDL Cholesterol Cal: 32 mg/dL (ref 5–40)

## 2014-01-10 LAB — SPECIMEN STATUS REPORT

## 2014-01-13 LAB — CMP14+EGFR
ALT: 14 IU/L (ref 0–32)
AST: 17 IU/L (ref 0–40)
Albumin/Globulin Ratio: 1.7 (ref 1.1–2.5)
Albumin: 4.4 g/dL (ref 3.5–4.8)
Alkaline Phosphatase: 53 IU/L (ref 39–117)
BUN/Creatinine Ratio: 14 (ref 11–26)
BUN: 11 mg/dL (ref 8–27)
CO2: 25 mmol/L (ref 18–29)
Calcium: 9.7 mg/dL (ref 8.7–10.3)
Chloride: 98 mmol/L (ref 97–108)
Creatinine, Ser: 0.8 mg/dL (ref 0.57–1.00)
GFR calc Af Amer: 85 mL/min/{1.73_m2} (ref >59)
GFR calc non Af Amer: 73 mL/min/{1.73_m2} (ref >59)
Globulin, Total: 2.6 g/dL (ref 1.5–4.5)
Glucose: 100 mg/dL — ABNORMAL HIGH (ref 65–99)
Potassium: 4.9 mmol/L (ref 3.5–5.2)
Sodium: 139 mmol/L (ref 134–144)
Total Bilirubin: 0.8 mg/dL (ref 0.0–1.2)
Total Protein: 7 g/dL (ref 6.0–8.5)

## 2014-01-13 LAB — NMR, LIPOPROFILE

## 2014-01-13 LAB — TSH: TSH: 1.94 u[IU]/mL (ref 0.450–4.500)

## 2014-01-13 MED ORDER — CHOLESTYRAMINE LIGHT 4 G PO PACK: 4.0000 g | PACK | Freq: Two times a day (BID) | ORAL | Status: DC

## 2014-01-13 NOTE — Telephone Encounter (Signed)
PT NOTIFIED TO CONTAC TAMMY REGARDING HER OPTIONS

## 2014-01-13 NOTE — Telephone Encounter (Signed)
Please make an appointment with Tammy to review her medications and labs and  Determine best options for patient.

## 2014-01-13 NOTE — Telephone Encounter (Signed)
I would recommend patient try generic cholestyramine 4 grams - mix with water and drink twice a day.  Appears that this is covered on her insurance per information in computer. Discontinue Welchol. Patient is called and aware rx sent to [facility]

## 2014-01-13 NOTE — Telephone Encounter (Signed)
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  She developed side effects with Zetia. The Colesevelam is $500 a month. Do you have any other suggestions?

## 2014-02-04 ENCOUNTER — Telehealth: Payer: Self-pay | Admitting: Family Medicine

## 2014-02-07 ENCOUNTER — Other Ambulatory Visit: Payer: Self-pay | Admitting: Family Medicine

## 2014-02-07 DIAGNOSIS — G4700 Insomnia, unspecified: Secondary | ICD-10-CM

## 2014-02-07 MED ORDER — ESZOPICLONE 2 MG PO TABS: 2.0000 mg | ORAL_TABLET | Freq: Every day | ORAL | Status: DC

## 2014-02-07 NOTE — Telephone Encounter (Signed)
Rx ready for nurse to Phone in. 

## 2014-02-07 NOTE — Telephone Encounter (Signed)
Pt aware  rx called to [facility]

## 2014-06-25 ENCOUNTER — Other Ambulatory Visit: Payer: Self-pay

## 2014-06-25 DIAGNOSIS — Z1231 Encounter for screening mammogram for malignant neoplasm of breast: Secondary | ICD-10-CM

## 2014-07-08 ENCOUNTER — Ambulatory Visit (INDEPENDENT_AMBULATORY_CARE_PROVIDER_SITE_OTHER): Payer: Medicare HMO | Admitting: Family Medicine

## 2014-07-08 ENCOUNTER — Encounter: Payer: Self-pay | Admitting: Family Medicine

## 2014-07-08 VITALS — BP 137/69 | HR 69 | Temp 98.1°F | Ht 64.0 in | Wt 136.0 lb

## 2014-07-08 DIAGNOSIS — I10 Essential (primary) hypertension: Secondary | ICD-10-CM

## 2014-07-08 DIAGNOSIS — E785 Hyperlipidemia, unspecified: Secondary | ICD-10-CM

## 2014-07-08 DIAGNOSIS — Z780 Asymptomatic menopausal state: Secondary | ICD-10-CM | POA: Diagnosis not present

## 2014-07-08 DIAGNOSIS — E039 Hypothyroidism, unspecified: Secondary | ICD-10-CM

## 2014-07-08 NOTE — Progress Notes (Signed)
   Subjective:    Patient ID: [name], female    DOB: 1941/01/07, 73 y.o.   MRN: [id number]  HPI 73 year old female here today for annual exam. Her only complaint is insomnia and she has tried multiple methods to help but still does not sleep well. She takes Lunesta occasionally. Her sleep hygiene appears to be good. We discussed her lipids and trials at statins and ZEtia. She brings in blood pressure monitoring and I will appears to be in the normal range.    Review of Systems  All other systems reviewed and are negative.      Objective:   Physical Exam  Constitutional: She is oriented to person, place, and time. She appears well-developed and well-nourished.  Eyes: Conjunctivae and EOM are normal.  Neck: Normal range of motion. Neck supple.  Cardiovascular: Normal rate, regular rhythm and normal heart sounds.   Pulmonary/Chest: Effort normal and breath sounds normal.  Abdominal: Soft. Bowel sounds are normal.  Musculoskeletal: Normal range of motion.  Neurological: She is alert and oriented to person, place, and time. She has normal reflexes.  Skin: Skin is warm and dry.  Psychiatric: She has a normal mood and affect. Her behavior is normal. Thought content normal.          Assessment & Plan:  The primary encounter diagnosis was HYPERTENSION. Diagnoses of HYPOTHYROIDISM, Hyperlipidemia, and Postmenopausal were also pertinent to this visit. Basically problems and symptoms are well managed. Will check TSH today. DEXA scan has been ordered.  Wardell Honour MD

## 2014-07-09 ENCOUNTER — Encounter: Payer: Self-pay | Admitting: Pharmacist

## 2014-07-09 ENCOUNTER — Ambulatory Visit (INDEPENDENT_AMBULATORY_CARE_PROVIDER_SITE_OTHER): Payer: Medicare HMO | Admitting: Pharmacist

## 2014-07-09 ENCOUNTER — Ambulatory Visit (INDEPENDENT_AMBULATORY_CARE_PROVIDER_SITE_OTHER): Payer: Medicare HMO

## 2014-07-09 VITALS — Ht 64.0 in | Wt 136.5 lb

## 2014-07-09 DIAGNOSIS — Z13820 Encounter for screening for osteoporosis: Secondary | ICD-10-CM

## 2014-07-09 DIAGNOSIS — Z780 Asymptomatic menopausal state: Secondary | ICD-10-CM

## 2014-07-09 LAB — TSH: TSH: 1.63 u[IU]/mL (ref 0.450–4.500)

## 2014-07-09 NOTE — Progress Notes (Signed)
Patient ID: [name], female   DOB: 10-23-1941, 74 y.o.   MRN: [id number] [facility] Current Height: Height: 5\' 4"  (162.6 cm)      Max Lifetime Height:  5\' 4"   Current Weight: Weight: 136 lb 8 oz (61.916 kg)       Ethnicity:Caucasian    HPI: Does pt already have a diagnosis of:  Osteopenia?  No Osteoporosis?  No  Back Pain?  No       Kyphosis?  No Prior fracture?  No Med(s) for Osteoporosis/Osteopenia:  none Med(s) previously tried for Osteoporosis/Osteopenia:  none                                                             PMH: Age at menopause:  73 yo - surgical Hysterectomy?  Yes Oophorectomy?  Yes HRT? Yes - Former.  Type/duration: premarin  Steroid Use?  No Thyroid med?  Yes History of cancer?  No History of digestive disorders (ie Crohn's)?  No   FH/SH: Family history of osteoporosis?  No Parent with history of hip fracture?  No Family history of breast cancer?  Yes - mother Exercise?  Yes - does varying inhome videos - walking, Zumba, aerobics and hip hop Smoking?  No Alcohol?  Yes - 1 glass of red wine daily    Calcium Assessment Calcium Intake  # of servings/day  Calcium mg  Milk (8 oz) 1  x  300  = 300mg   Yogurt (4 oz) 0 x  200 = 0  Cheese (1 oz) 0 x  200 = 0  Other Calcium sources   250mg   Ca supplement viactive 1-2 times a day = 500 - 1000   Estimated calcium intake per day 1050 - 1550mg     DEXA Results Date of Test T-Score for AP Spine L1-L4 T-Score for Total Left Hip T-Score for Total Right Hip  07/09/2014 0.7 0.2 -0.1  07/27/2011 1.0 0.4 0.1  06/03/2009 1.2 0.6 0.4        Assessment: Normal BMD with low fracture risk  Recommendations: 1. Discussed BMD results and fracture risk 2.  continue calcium 1200mg  daily through supplementation or diet.  3.  continue weight bearing exercise - 30 minutes at least 4 days per week.   4.  Counseled and educated about fall risk and prevention.  Recheck DEXA:  2-5 years  Time spent  counseling patient:  20 minutes  Cherre Robins, PharmD, CPP

## 2014-07-09 NOTE — Patient Instructions (Signed)

## 2014-08-14 ENCOUNTER — Ambulatory Visit
Admission: RE | Admit: 2014-08-14 | Discharge: 2014-08-14 | Disposition: A | Discharge status: Home / Self Care | Payer: Medicare HMO | Source: Ambulatory Visit

## 2014-08-14 DIAGNOSIS — Z1231 Encounter for screening mammogram for malignant neoplasm of breast: Secondary | ICD-10-CM

## 2014-09-26 ENCOUNTER — Other Ambulatory Visit: Payer: Self-pay

## 2015-01-01 ENCOUNTER — Telehealth: Payer: Self-pay | Admitting: Family Medicine

## 2015-01-01 DIAGNOSIS — E039 Hypothyroidism, unspecified: Secondary | ICD-10-CM

## 2015-01-03 MED ORDER — LEVOTHYROXINE SODIUM 100 MCG PO TABS: 100.0000 ug | ORAL_TABLET | Freq: Every day | ORAL | Status: DC

## 2015-01-03 NOTE — Telephone Encounter (Signed)
done

## 2015-03-16 ENCOUNTER — Other Ambulatory Visit: Payer: Self-pay | Admitting: *Deleted

## 2015-03-16 DIAGNOSIS — I10 Essential (primary) hypertension: Secondary | ICD-10-CM

## 2015-03-16 MED ORDER — ATENOLOL 50 MG PO TABS: 50.0000 mg | ORAL_TABLET | Freq: Every day | ORAL | Status: DC

## 2015-06-08 ENCOUNTER — Other Ambulatory Visit: Payer: Self-pay

## 2015-06-17 ENCOUNTER — Other Ambulatory Visit: Payer: Self-pay | Admitting: Family Medicine

## 2015-06-17 ENCOUNTER — Encounter: Payer: Self-pay | Admitting: *Deleted

## 2015-06-18 ENCOUNTER — Ambulatory Visit (INDEPENDENT_AMBULATORY_CARE_PROVIDER_SITE_OTHER): Payer: Medicare HMO | Admitting: *Deleted

## 2015-06-18 DIAGNOSIS — Z23 Encounter for immunization: Secondary | ICD-10-CM | POA: Diagnosis not present

## 2015-06-18 NOTE — Patient Instructions (Signed)
Pneumococcal Conjugate Vaccine: What You Need to Know  Your doctor recommends that you, or your child, get a dose of PCV13 today.  1. Why get vaccinated?  Pneumococcal conjugate vaccine (called PCV13 or Prevnar 13) is recommended to protect infants and toddlers, and some older children and adults with certain health conditions, from pneumococcal disease.  Pneumococcal disease is caused by infection with Streptococcus pneumoniae bacteria. These bacteria can spread from person to person through close contact.  Pneumococcal disease can lead to severe health problems, including pneumonia, blood infections, and meningitis.  Meningitis is an infection of the covering of the brain. Pneumococcal meningitis is fairly rare (less than 1 case per 100,000 people each year), but it leads to other health problems, including deafness and brain damage. In children, it is fatal in about 1 case out of 10.  Children younger than two are at higher risk for serious disease than older children.  People with certain medical conditions, people over age 65, and cigarette smokers are also at higher risk.  Before vaccine, pneumococcal infections caused many problems each year in the [place] in children younger than 5, including:  · more than 700 cases of meningitis,  · 13,000 blood infections,  · about 5 million ear infections, and  · about 200 deaths.  About 4,000 adults still die each year because of pneumococcal infections.  Pneumococcal infections can be hard to treat because some strains are resistant to antibiotics. This makes prevention through vaccination even more important.  2. PCV13 vaccine  There are more than 90 types of pneumococcal bacteria. PCV13 protects against 13 of them. These 13 strains cause most severe infections in children and about half of infections in adults.   PCV13 is routinely given to children at 2, 4, 6, and 12-15 months of age. Children in this age range are at greatest risk for serious diseases caused  by pneumococcal infection.  PCV13 vaccine may also be recommended for some older children or adults. Your doctor can give you details.  A second type of pneumococcal vaccine, called PPSV23, may also be given to some children and adults, including anyone over age 65. There is a separate Vaccine Information Statement for this vaccine.  3. Precautions   Anyone who has ever had a life-threatening allergic reaction to a dose of this vaccine, to an earlier pneumococcal vaccine called PCV7 (or Prevnar), or to any vaccine containing diphtheria toxoid (for example, DTaP), should not get PCV13.  Anyone with a severe allergy to any component of PCV13 should not get the vaccine. Tell your doctor if the person being vaccinated has any severe allergies.  If the person scheduled for vaccination is sick, your doctor might decide to reschedule the shot on another day.  Your doctor can give you more information about any of these precautions.  4. What are the risks of PCV13 vaccine?   With any medicine, including vaccines, there is a chance of side effects. These are usually mild and go away on their own, but serious reactions are also possible.  Reported problems associated with PCV13 vary by dose and age, but generally:  · About half of children became drowsy after the shot, had a temporary loss of appetite, or had redness or tenderness where the shot was given.  · About 1 out of 3 had swelling where the shot was given.  · About 1 out of 3 had a mild fever, and about 1 in 20 had a higher fever (over 102.2°F).  ·   Up to about 8 out of 10 became fussy or irritable.  Adults receiving the vaccine have reported redness, pain, and swelling where the shot was given. Mild fever, fatigue, headache, chills, or muscle pain have also been reported.  Life-threatening allergic reactions from any vaccine are very rare.  5. What if there is a serious reaction?  What should I look for?  · Look for anything that concerns you, such as signs of a  severe allergic reaction, very high fever, or behavior changes.  Signs of a severe allergic reaction can include hives, swelling of the face and throat, difficulty breathing, a fast heartbeat, dizziness, and weakness. These would start a few minutes to a few hours after the vaccination.  What should I do?  · If you think it is a severe allergic reaction or other emergency that can't wait, call 9-1-1 or get the person to the nearest hospital. Otherwise, call your doctor.  · Afterward, the reaction should be reported to the Vaccine Adverse Event Reporting System (VAERS). Your doctor might file this report, or you can do it yourself through the VAERS web site at www.vaers.hhs.gov, or by calling 1-800-822-7967.  VAERS is only for reporting reactions. They do not give medical advice.  6. The National Vaccine Injury Compensation Program  The National Vaccine Injury Compensation Program (VICP) is a federal program that was created to compensate people who may have been injured by certain vaccines.  Persons who believe they may have been injured by a vaccine can learn about the program and about filing a claim by calling 1-800-338-2382 or visiting the VICP website at www.hrsa.gov/vaccinecompensation.  7. How can I learn more?  · Ask your doctor.  · Call your local or state health department.  · Contact the Centers for Disease Control and Prevention (CDC):  ¨ Call 1-800-232-4636 (1-800-CDC-INFO) or  ¨ Visit CDC's website at www.cdc.gov/vaccines  CDC PCV13 Vaccine VIS (Interim) (02/08/12)  Document Released: 09/25/2006 Document Revised: 04/14/2014 Document Reviewed: 01/17/2014  ExitCare® Patient Information ©2015 [employer]. This information is not intended to replace advice given to you by your health care provider. Make sure you discuss any questions you have with your health care provider.

## 2015-06-18 NOTE — Progress Notes (Signed)
prevnar given and tolerated well 

## 2015-07-13 ENCOUNTER — Other Ambulatory Visit: Payer: Self-pay

## 2015-07-13 DIAGNOSIS — Z1231 Encounter for screening mammogram for malignant neoplasm of breast: Secondary | ICD-10-CM

## 2015-07-15 ENCOUNTER — Encounter: Payer: Self-pay | Admitting: Family Medicine

## 2015-07-22 ENCOUNTER — Other Ambulatory Visit: Payer: Medicare HMO

## 2015-07-22 ENCOUNTER — Encounter: Payer: Self-pay | Admitting: Family Medicine

## 2015-07-22 ENCOUNTER — Ambulatory Visit (INDEPENDENT_AMBULATORY_CARE_PROVIDER_SITE_OTHER): Payer: Medicare HMO | Admitting: Family Medicine

## 2015-07-22 ENCOUNTER — Other Ambulatory Visit: Payer: Self-pay | Admitting: *Deleted

## 2015-07-22 VITALS — BP 156/78 | HR 73 | Temp 97.5°F | Ht 64.0 in | Wt 141.6 lb

## 2015-07-22 DIAGNOSIS — E039 Hypothyroidism, unspecified: Secondary | ICD-10-CM | POA: Diagnosis not present

## 2015-07-22 DIAGNOSIS — G4700 Insomnia, unspecified: Secondary | ICD-10-CM

## 2015-07-22 DIAGNOSIS — Z0000 Encounter for general adult medical examination without abnormal findings: Secondary | ICD-10-CM

## 2015-07-22 MED ORDER — LEVOTHYROXINE SODIUM 100 MCG PO TABS: 100.0000 ug | ORAL_TABLET | Freq: Every day | ORAL | Status: DC

## 2015-07-22 MED ORDER — ATENOLOL 50 MG PO TABS
50.0000 mg | ORAL_TABLET | Freq: Every day | ORAL | Status: DC
Start: 2015-07-22 — End: 2015-07-22

## 2015-07-22 MED ORDER — ATENOLOL 50 MG PO TABS: 50.0000 mg | ORAL_TABLET | Freq: Every day | ORAL | Status: DC

## 2015-07-22 MED ORDER — ESZOPICLONE 2 MG PO TABS
2.0000 mg | ORAL_TABLET | Freq: Every evening | ORAL | Status: DC | PRN
Start: 2015-07-22 — End: 2016-01-21

## 2015-07-22 NOTE — Progress Notes (Signed)
Subjective:    Patient ID: [name], female    DOB: 1941/01/05, 74 y.o.   MRN: [id number]  HPI 74 year old female here to follow-up blood pressure, hypothyroidism, hyperlipidemia, and insomnia. She brings in a list of her blood pressures over the last year and generally they have been good. There are pressures recorded in the morning as well as in the evening and basically all are at at target. Her pressure today in the office however is slightly elevated. She has been on atenolol since she was placed on that for PVCs as well as hypertension. I'm not inclined to change it since she has no further palpitations and pressure is normally well controlled.  She has a high CHD risk but she cannot tolerate statins. She exercises regularly watches her diet. If lipids are still elevated on today's lab consider addition of Zetia which she is willing to try.  She does take Lunesta 1 mg about once a week but sleeps poorly rest of the week. We discussed increasing that to 2 or 3 times a week as needed.  Patient Active Problem List   Diagnosis Date Noted  . Hyperlipidemia 08/19/2013  . Annual physical exam 07/04/2013  . HYPOTHYROIDISM 11/21/2008  . HYPERTENSION 11/21/2008  . ALLERGIC RHINITIS 11/21/2008  . Nonspecific (abnormal) findings on radiological and other examination of body structure 11/21/2008  . ABNORMAL CHEST XRAY 11/21/2008   Outpatient Encounter Prescriptions as of 07/22/2015  Medication Sig  . atenolol (TENORMIN) 50 MG tablet TAKE ONE TABLET BY MOUTH ONCE DAILY  . Cholecalciferol (VITAMIN D-3) 1000 UNITS CAPS Take 1 capsule by mouth daily.  . Coenzyme Q10 (CO Q 10) 100 MG CAPS Take 1 capsule by mouth daily.  . eszopiclone (LUNESTA) 2 MG TABS tablet Take 1 tablet (2 mg total) by mouth at bedtime. Take immediately before bedtime (Patient taking differently: Take 2 mg by mouth at bedtime as needed. Take immediately before bedtime)  . fish oil-omega-3 fatty acids 1000 MG capsule Take 1 g  by mouth.  . Glucosamine HCl 1000 MG TABS Take 1 tablet by mouth daily.  Marland Kitchen levothyroxine (SYNTHROID, LEVOTHROID) 100 MCG tablet Take 1 tablet (100 mcg total) by mouth daily before breakfast.  . multivitamin-lutein (OCUVITE-LUTEIN) CAPS Take 1 capsule by mouth daily.  . Probiotic Product (SOLUBLE FIBER/PROBIOTICS PO) Take by mouth.  . vitamin E 400 UNIT capsule Take 400 Units by mouth daily.   No facility-administered encounter medications on file as of 07/22/2015.      Review of Systems  Constitutional: Negative.   Cardiovascular: Negative.   Gastrointestinal: Negative.   Musculoskeletal: Negative.   Skin: Negative.   Neurological: Negative.   Psychiatric/Behavioral: Positive for sleep disturbance.       Objective:   Physical Exam  Constitutional: She is oriented to person, place, and time. She appears well-developed and well-nourished.  HENT:  Head: Normocephalic.  Eyes: Pupils are equal, round, and reactive to light.  Cardiovascular: Normal rate and regular rhythm.   Pulmonary/Chest: Effort normal and breath sounds normal.  Abdominal: Soft.  Musculoskeletal: Normal range of motion.  Neurological: She is alert and oriented to person, place, and time. She has normal reflexes.  Psychiatric: She has a normal mood and affect. Her behavior is normal.          Assessment & Plan:  1. Annual physical exam Exam is within normal limits  2. Insomnia 10 year with Lunesta but gave her permission to take it 2-3 times a week - eszopiclone (LUNESTA) 2  MG TABS tablet; Take 1 tablet (2 mg total) by mouth at bedtime as needed. Take immediately before bedtime  Dispense: 30 tablet; Refill: 1  3. Hypothyroidism, adult It has been 1-1/2 year since TSH was last checked. She is asymptomatic. We'll plan on also checking lipids with consideration to begin Zetia - TSH - Lipid panel  Wardell Honour MD

## 2015-07-23 ENCOUNTER — Other Ambulatory Visit: Payer: Self-pay

## 2015-07-23 LAB — LIPID PANEL
CHOL/HDL RATIO: 5 ratio — AB (ref 0.0–4.4)
Cholesterol, Total: 215 mg/dL — ABNORMAL HIGH (ref 100–199)
HDL: 43 mg/dL (ref >39)
LDL CALC: 139 mg/dL — AB (ref 0–99)
TRIGLYCERIDES: 164 mg/dL — AB (ref 0–149)
VLDL Cholesterol Cal: 33 mg/dL (ref 5–40)

## 2015-07-23 LAB — TSH: TSH: 2.41 u[IU]/mL (ref 0.450–4.500)

## 2015-08-20 ENCOUNTER — Ambulatory Visit
Admission: RE | Admit: 2015-08-20 | Discharge: 2015-08-20 | Disposition: A | Discharge status: Home / Self Care | Payer: Medicare HMO | Source: Ambulatory Visit

## 2015-08-20 DIAGNOSIS — Z1231 Encounter for screening mammogram for malignant neoplasm of breast: Secondary | ICD-10-CM

## 2015-10-13 ENCOUNTER — Telehealth: Payer: Self-pay | Admitting: Family Medicine

## 2016-01-21 ENCOUNTER — Telehealth: Payer: Self-pay | Admitting: Family Medicine

## 2016-01-21 DIAGNOSIS — G4700 Insomnia, unspecified: Secondary | ICD-10-CM

## 2016-01-21 MED ORDER — ESZOPICLONE 2 MG PO TABS: 2.0000 mg | ORAL_TABLET | Freq: Every evening | ORAL | Status: DC | PRN

## 2016-01-21 NOTE — Telephone Encounter (Signed)
Please renew generic Lunesta for this patient 3 month

## 2016-01-21 NOTE — Telephone Encounter (Signed)
rx called into pharmacy and left detailed message that requested rx was called in and to [facility] with any further questions.

## 2016-01-22 ENCOUNTER — Telehealth: Payer: Self-pay

## 2016-01-22 NOTE — Telephone Encounter (Signed)
Insurance prior authorized Eszopiclone through 12/11/16

## 2016-04-18 DIAGNOSIS — R69 Illness, unspecified: Secondary | ICD-10-CM | POA: Diagnosis not present

## 2016-05-18 ENCOUNTER — Encounter: Payer: Self-pay | Admitting: Family

## 2016-05-18 ENCOUNTER — Ambulatory Visit (INDEPENDENT_AMBULATORY_CARE_PROVIDER_SITE_OTHER): Payer: Medicare HMO | Admitting: Family

## 2016-05-18 VITALS — BP 178/90 | HR 91 | Temp 97.6°F

## 2016-05-18 DIAGNOSIS — S0081XA Abrasion of other part of head, initial encounter: Secondary | ICD-10-CM | POA: Diagnosis not present

## 2016-05-18 DIAGNOSIS — S81812A Laceration without foreign body, left lower leg, initial encounter: Secondary | ICD-10-CM

## 2016-05-18 DIAGNOSIS — S0093XA Contusion of unspecified part of head, initial encounter: Secondary | ICD-10-CM | POA: Diagnosis not present

## 2016-05-18 DIAGNOSIS — M79642 Pain in left hand: Secondary | ICD-10-CM | POA: Diagnosis not present

## 2016-05-18 DIAGNOSIS — W19XXXA Unspecified fall, initial encounter: Secondary | ICD-10-CM

## 2016-05-18 MED ORDER — CEPHALEXIN 500 MG PO CAPS: 500.0000 mg | ORAL_CAPSULE | Freq: Three times a day (TID) | ORAL | Status: DC

## 2016-05-18 NOTE — Progress Notes (Signed)
Subjective:    Patient ID: [name], female    DOB: Sep 17, 1941, 75 y.o.   MRN: [id number]  PT presents to the office today after falling over a metal trailer and hitting the concrete. Pt states she fell and hit her forehead, left hand, and left shin. PT denies any headache, double vision, trouble with gait, or slurred speech at this time.  Fall The accident occurred 6 to 12 hours ago. Fall occurred: tripped over a trailor. She landed on concrete. The volume of blood lost was minimal. The point of impact was the face and left wrist. The pain is present in the head, left wrist and left lower leg. The pain is at a severity of 6/10. The pain is mild. The symptoms are aggravated by ambulation. Pertinent negatives include no bowel incontinence, fever, headaches, hematuria, loss of consciousness, nausea, numbness, visual change or vomiting. She has tried ice for the symptoms. The treatment provided mild relief.      Review of Systems  Constitutional: Negative for fever.  Gastrointestinal: Negative for nausea, vomiting and bowel incontinence.  Genitourinary: Negative for hematuria.  Neurological: Negative for loss of consciousness, numbness and headaches.  All other systems reviewed and are negative.      Objective:   Physical Exam  Constitutional: She is oriented to person, place, and time. She appears well-developed and well-nourished. No distress.  Eyes: Pupils are equal, round, and reactive to light.  Cardiovascular: Normal rate, regular rhythm, normal heart sounds and intact distal pulses.   No murmur heard. Pulmonary/Chest: Effort normal and breath sounds normal. No respiratory distress. She has no wheezes.  Abdominal: Soft. Bowel sounds are normal. She exhibits no distension. There is no tenderness.  Musculoskeletal: Normal range of motion. She exhibits no edema or tenderness.  Neurological: She is alert and oriented to person, place, and time.  Skin: Skin is warm and dry.  Abrasion (forearm and bridge of nose) and lesion noted.  Psychiatric: She has a normal mood and affect. Her behavior is normal. Judgment and thought content normal.  Vitals reviewed.   BP 178/90 mmHg  Pulse 91  Temp(Src) 97.6 F (36.4 C) (Oral)  Wt   Verbal consent given by patient. Local anesthesia Lidocaine 1% without 57ml Betadine prep Vicryl 5-0suture-8 simple interrupted stitches Cleaned with Saline Antibiotic ointment Dressing applied      Assessment & Plan:  1. Laceration of leg, left, initial encounter - cephALEXin (KEFLEX) 500 MG capsule; Take 1 capsule (500 mg total) by mouth 3 (three) times daily.  Dispense: 21 capsule; Refill: 0  2. Contusion of head, initial encounter - cephALEXin (KEFLEX) 500 MG capsule; Take 1 capsule (500 mg total) by mouth 3 (three) times daily.  Dispense: 21 capsule; Refill: 0  3. Abrasion of face, initial encounter - cephALEXin (KEFLEX) 500 MG capsule; Take 1 capsule (500 mg total) by mouth 3 (three) times daily.  Dispense: 21 capsule; Refill: 0  4. Hand pain, left - cephALEXin (KEFLEX) 500 MG capsule; Take 1 capsule (500 mg total) by mouth 3 (three) times daily.  Dispense: 21 capsule; Refill: 0  5. Fall, initial encounter - cephALEXin (KEFLEX) 500 MG capsule; Take 1 capsule (500 mg total) by mouth 3 (three) times daily.  Dispense: 21 capsule; Refill: 0  Keep wounds clean and dry Discussed in length if she starts having double vision, slurred speech, severe headache, change in gait to go to ED for head CT If pain in hand does not improve pt to come back tomorrow  for x-ray since we do not have access to x-ray at this time RTO 05/27/16 to have sutures removed  Evelina Dun, FNP

## 2016-05-18 NOTE — Patient Instructions (Signed)

## 2016-05-26 ENCOUNTER — Encounter: Payer: Self-pay | Admitting: Family

## 2016-05-26 ENCOUNTER — Ambulatory Visit (INDEPENDENT_AMBULATORY_CARE_PROVIDER_SITE_OTHER): Payer: Medicare HMO | Admitting: Family

## 2016-05-26 VITALS — BP 130/79 | HR 82 | Temp 98.0°F | Ht 64.0 in | Wt 140.2 lb

## 2016-05-26 DIAGNOSIS — Z4802 Encounter for removal of sutures: Secondary | ICD-10-CM

## 2016-05-26 NOTE — Progress Notes (Signed)
   Subjective:    Patient ID: [name], female    DOB: 06/22/1941, 75 y.o.   MRN: [id number]  HPI PT presents to the office today to have sutures on left lower leg. Pt fell on 05/18/16 and had 8 sutures placed. Pt reports do well and denies any redness, drainage, or fever at this time. Pt completed keflex.   Review of Systems  All other systems reviewed and are negative.      Objective:   Physical Exam  Constitutional: She is oriented to person, place, and time. She appears well-developed and well-nourished. No distress.  HENT:  Head: Normocephalic and atraumatic.  Right Ear: External ear normal.  Mouth/Throat: Oropharynx is clear and moist.  Eyes: Pupils are equal, round, and reactive to light.  Neck: Normal range of motion. Neck supple. No thyromegaly present.  Cardiovascular: Normal rate, regular rhythm, normal heart sounds and intact distal pulses.   No murmur heard. Pulmonary/Chest: Effort normal and breath sounds normal. No respiratory distress. She has no wheezes.  Abdominal: Soft. Bowel sounds are normal. She exhibits no distension. There is no tenderness.  Musculoskeletal: Normal range of motion. She exhibits no edema or tenderness.  Neurological: She is alert and oriented to person, place, and time. She has normal reflexes. No cranial nerve deficit.  Skin: Skin is warm and dry.  Psychiatric: She has a normal mood and affect. Her behavior is normal. Judgment and thought content normal.  Vitals reviewed.  8 sutures removed from left lower leg. Ointment applied.   BP 144/81 mmHg  Pulse 81  Temp(Src) 98 F (36.7 C) (Oral)  Ht 5\' 4"  (1.626 m)  Wt 140 lb 3.2 oz (63.594 kg)  BMI 24.05 kg/m2      Assessment & Plan:  1. Visit for suture removal -Keep clean and dry -Apply antibiotic ointment as needed -RTO prn   Evelina Dun, FNP

## 2016-05-26 NOTE — Patient Instructions (Signed)

## 2016-07-22 ENCOUNTER — Other Ambulatory Visit: Payer: Self-pay | Admitting: Family Medicine

## 2016-07-22 DIAGNOSIS — Z1231 Encounter for screening mammogram for malignant neoplasm of breast: Secondary | ICD-10-CM

## 2016-07-28 ENCOUNTER — Encounter: Payer: Self-pay | Admitting: Family Medicine

## 2016-07-28 ENCOUNTER — Encounter: Payer: Medicare HMO | Admitting: Family Medicine

## 2016-07-28 ENCOUNTER — Ambulatory Visit (INDEPENDENT_AMBULATORY_CARE_PROVIDER_SITE_OTHER): Payer: Medicare HMO | Admitting: Family Medicine

## 2016-07-28 ENCOUNTER — Ambulatory Visit (INDEPENDENT_AMBULATORY_CARE_PROVIDER_SITE_OTHER): Payer: Medicare HMO

## 2016-07-28 ENCOUNTER — Other Ambulatory Visit: Payer: Self-pay | Admitting: *Deleted

## 2016-07-28 VITALS — BP 129/80 | HR 85 | Temp 97.9°F | Ht 64.0 in | Wt 139.8 lb

## 2016-07-28 DIAGNOSIS — E039 Hypothyroidism, unspecified: Secondary | ICD-10-CM

## 2016-07-28 DIAGNOSIS — G4700 Insomnia, unspecified: Secondary | ICD-10-CM

## 2016-07-28 DIAGNOSIS — Z780 Asymptomatic menopausal state: Secondary | ICD-10-CM | POA: Diagnosis not present

## 2016-07-28 LAB — BAYER DCA HB A1C WAIVED: HB A1C (BAYER DCA - WAIVED): 5.6 % (ref <7.0)

## 2016-07-28 MED ORDER — ESZOPICLONE 2 MG PO TABS: 2.0000 mg | ORAL_TABLET | Freq: Every evening | ORAL | 2 refills | Status: DC | PRN

## 2016-07-28 MED ORDER — LEVOTHYROXINE SODIUM 100 MCG PO TABS: 100.0000 ug | ORAL_TABLET | Freq: Every day | ORAL | 3 refills | Status: DC

## 2016-07-28 MED ORDER — ATENOLOL 50 MG PO TABS: 50.0000 mg | ORAL_TABLET | Freq: Every day | ORAL | 3 refills | Status: DC

## 2016-07-28 NOTE — Progress Notes (Signed)
Subjective:    Patient ID: [name], female    DOB: [birth year], 75 y.o.   MRN: [id number]  HPI 75 year old female who is here to follow-up hypertension hyperlipidemia and hypothyroid is. She is really doing well for her age. She brings in a recording of her blood pressure over the last several months and all pressures are normal. She takes atenolol 50 mg at nighttime. Thyroid was last checked 1 year ago so we need to follow-up with that today. Lipids are elevated and she might benefit from a statin given her Framingham risk assessment.  Patient Active Problem List   Diagnosis Date Noted  . Hyperlipidemia 08/19/2013  . Annual physical exam 07/04/2013  . HYPOTHYROIDISM 11/21/2008  . HYPERTENSION 11/21/2008  . ALLERGIC RHINITIS 11/21/2008  . Nonspecific (abnormal) findings on radiological and other examination of body structure 11/21/2008  . ABNORMAL CHEST XRAY 11/21/2008   Outpatient Encounter Prescriptions as of 07/28/2016  Medication Sig  . atenolol (TENORMIN) 50 MG tablet Take 1 tablet (50 mg total) by mouth daily.  . Biotin 1000 MCG tablet Take 1,000 mcg by mouth daily.  . calcium-vitamin D (CALCIUM 500/D) 500-200 MG-UNIT tablet Take 1 tablet by mouth daily with breakfast.  . Cholecalciferol (VITAMIN D-3) 1000 UNITS CAPS Take 1 capsule by mouth daily.  . Coenzyme Q10 (CO Q 10) 100 MG CAPS Take 1 capsule by mouth daily.  . eszopiclone (LUNESTA) 2 MG TABS tablet Take 1 tablet (2 mg total) by mouth at bedtime as needed. Take immediately before bedtime  . fish oil-omega-3 fatty acids 1000 MG capsule Take 1 g by mouth.  . Glucosamine HCl 1000 MG TABS Take 1 tablet by mouth daily.  Marland Kitchen levothyroxine (SYNTHROID, LEVOTHROID) 100 MCG tablet Take 1 tablet (100 mcg total) by mouth daily before breakfast.  . multivitamin-lutein (OCUVITE-LUTEIN) CAPS Take 1 capsule by mouth daily.  . Probiotic Product (SOLUBLE FIBER/PROBIOTICS PO) Take by mouth.  . Turmeric 500 MG CAPS Take 1 capsule by mouth  daily.  . vitamin E 400 UNIT capsule Take 400 Units by mouth daily.   No facility-administered encounter medications on file as of 07/28/2016.       Review of Systems  Constitutional: Negative.   Respiratory: Negative.   Cardiovascular: Negative.   Neurological: Negative.   Psychiatric/Behavioral: Negative.        Objective:   Physical Exam  Constitutional: She is oriented to person, place, and time. She appears well-developed and well-nourished.  Cardiovascular: Normal rate, regular rhythm and normal heart sounds.   Pulmonary/Chest: Effort normal and breath sounds normal.  Neurological: She is alert and oriented to person, place, and time.  Psychiatric: She has a normal mood and affect. Her behavior is normal. Thought content normal.   BP 129/80 (BP Location: Right Arm, Patient Position: Sitting, Cuff Size: Normal)   Pulse 85   Temp 97.9 F (36.6 C) (Oral)   Ht _0  (1.626 m)   Wt 139 lb 12.8 oz (63.4 kg)   BMI 24.00 kg/m         Assessment & Plan:  1. Hypothyroidism, adult Asymptomatic we'll reassess thyroid function today - Bayer DCA Hb A1c Waived - CMP14+EGFR - Lipid panel - TSH - levothyroxine (SYNTHROID, LEVOTHROID) 100 MCG tablet; Take 1 tablet (100 mcg total) by mouth daily before breakfast.  Dispense: 90 tablet; Refill: 3  2. Insomnia Uses sedative hypnotic sparingly - eszopiclone (LUNESTA) 2 MG TABS tablet; Take 1 tablet (2 mg total) by mouth at bedtime as needed. Take  immediately before bedtime  Dispense: 30 tablet; Refill: 2  3. Postmenopausal She takes calcium and vitamin D but needs follow-up bone density - DG [facility] DEXA Wardell Honour MD

## 2016-07-29 LAB — LIPID PANEL
CHOLESTEROL TOTAL: 219 mg/dL — AB (ref 100–199)
Chol/HDL Ratio: 4.9 ratio units — ABNORMAL HIGH (ref 0.0–4.4)
HDL: 45 mg/dL (ref >39)
LDL Calculated: 140 mg/dL — ABNORMAL HIGH (ref 0–99)
TRIGLYCERIDES: 170 mg/dL — AB (ref 0–149)
VLDL Cholesterol Cal: 34 mg/dL (ref 5–40)

## 2016-07-29 LAB — CMP14+EGFR
ALK PHOS: 48 IU/L (ref 39–117)
ALT: 17 IU/L (ref 0–32)
AST: 22 IU/L (ref 0–40)
Albumin/Globulin Ratio: 1.6 (ref 1.2–2.2)
Albumin: 4.1 g/dL (ref 3.5–4.8)
BILIRUBIN TOTAL: 0.8 mg/dL (ref 0.0–1.2)
BUN / CREAT RATIO: 19 (ref 12–28)
BUN: 13 mg/dL (ref 8–27)
CHLORIDE: 100 mmol/L (ref 96–106)
CO2: 24 mmol/L (ref 18–29)
CREATININE: 0.68 mg/dL (ref 0.57–1.00)
Calcium: 9 mg/dL (ref 8.7–10.3)
GFR calc Af Amer: 98 mL/min/{1.73_m2} (ref >59)
GFR calc non Af Amer: 85 mL/min/{1.73_m2} (ref >59)
GLUCOSE: 99 mg/dL (ref 65–99)
Globulin, Total: 2.6 g/dL (ref 1.5–4.5)
Potassium: 4.5 mmol/L (ref 3.5–5.2)
Sodium: 140 mmol/L (ref 134–144)
Total Protein: 6.7 g/dL (ref 6.0–8.5)

## 2016-07-29 LAB — TSH: TSH: 2.13 u[IU]/mL (ref 0.450–4.500)

## 2016-08-30 ENCOUNTER — Ambulatory Visit
Admission: RE | Admit: 2016-08-30 | Discharge: 2016-08-30 | Disposition: A | Discharge status: Home / Self Care | Payer: Medicare HMO | Source: Ambulatory Visit | Attending: Family Medicine | Admitting: Family Medicine

## 2016-08-30 DIAGNOSIS — Z1231 Encounter for screening mammogram for malignant neoplasm of breast: Secondary | ICD-10-CM

## 2016-09-01 ENCOUNTER — Ambulatory Visit: Payer: Medicare HMO

## 2016-09-05 NOTE — Progress Notes (Signed)
Patient aware.

## 2017-01-02 DIAGNOSIS — R69 Illness, unspecified: Secondary | ICD-10-CM | POA: Diagnosis not present

## 2017-01-06 DIAGNOSIS — Z961 Presence of intraocular lens: Secondary | ICD-10-CM | POA: Diagnosis not present

## 2017-01-06 DIAGNOSIS — H04123 Dry eye syndrome of bilateral lacrimal glands: Secondary | ICD-10-CM | POA: Diagnosis not present

## 2017-01-06 DIAGNOSIS — H2511 Age-related nuclear cataract, right eye: Secondary | ICD-10-CM | POA: Diagnosis not present

## 2017-01-06 DIAGNOSIS — H35372 Puckering of macula, left eye: Secondary | ICD-10-CM | POA: Diagnosis not present

## 2017-01-31 ENCOUNTER — Other Ambulatory Visit: Payer: Self-pay | Admitting: Family Medicine

## 2017-01-31 DIAGNOSIS — G4700 Insomnia, unspecified: Secondary | ICD-10-CM

## 2017-02-08 ENCOUNTER — Other Ambulatory Visit: Payer: Self-pay | Admitting: Family Medicine

## 2017-02-08 DIAGNOSIS — G4700 Insomnia, unspecified: Secondary | ICD-10-CM

## 2017-02-08 NOTE — Telephone Encounter (Signed)
What is the name of the medication? Lunesta   Have you contacted your pharmacy to request a refill? Yes her prescription ran out she did not go get it filled since she did not need it.  Which pharmacy would you like this sent to? [facility] [place]   Patient notified that their request is being sent to the clinical staff for review and that they should receive a call once it is complete. If they do not receive a call within 24 hours they can check with their pharmacy or our office.

## 2017-02-08 NOTE — Telephone Encounter (Signed)
Last seen and filled 07/28/16. Millers pt. Call in

## 2017-02-09 NOTE — Telephone Encounter (Signed)
Pt aware and states she will call back to schedule an appt.

## 2017-05-02 ENCOUNTER — Other Ambulatory Visit: Payer: Self-pay | Admitting: Family Medicine

## 2017-05-03 NOTE — Telephone Encounter (Signed)
Authorize 30 days only. Then contact the patient letting them know that they will need an appointment before any further prescriptions can be sent in. 

## 2017-06-27 ENCOUNTER — Ambulatory Visit (INDEPENDENT_AMBULATORY_CARE_PROVIDER_SITE_OTHER): Payer: Medicare HMO | Admitting: Family Medicine

## 2017-06-27 VITALS — BP 180/85 | HR 87 | Temp 98.3°F | Ht 64.0 in | Wt 144.0 lb

## 2017-06-27 DIAGNOSIS — M545 Low back pain: Secondary | ICD-10-CM | POA: Diagnosis not present

## 2017-06-27 DIAGNOSIS — G4700 Insomnia, unspecified: Secondary | ICD-10-CM

## 2017-06-27 DIAGNOSIS — M79604 Pain in right leg: Secondary | ICD-10-CM

## 2017-06-27 MED ORDER — PREDNISONE 10 MG PO TABS: ORAL_TABLET | ORAL | 0 refills | Status: DC

## 2017-06-27 MED ORDER — ESZOPICLONE 2 MG PO TABS
2.0000 mg | ORAL_TABLET | Freq: Every evening | ORAL | 2 refills | Status: DC | PRN
Start: 2017-06-27 — End: 2018-08-08

## 2017-06-27 NOTE — Progress Notes (Signed)
Subjective:  Patient ID: [name], female    DOB: 1941-08-22  Age: 76 y.o. MRN: [id number]  CC: Back Pain (pt here today c/o right lower back pain that shoots down the right leg after trying to push a heavy piece of furniture.)   HPI [name] presents for Onset 3 weeks ago with a dull ache after moving some furniture. It was getting better until a couple of days ago she tried to push her recliner and felt increased pain which she says is an 8-9/10 unless she is medicated herself with something like ibuprofen or Tylenol or both. It is fairly constant it keeps her awake at night it is a dull ache that radiates to her right buttock and down the posterior lateral aspect of the right leg/thigh.  Depression screen [facility] 2/9 06/27/2017 07/28/2016 05/26/2016  Decreased Interest 0 0 0  Down, Depressed, Hopeless 0 0 0  PHQ - 2 Score 0 0 0    History Elantra has a past medical history of Hyperlipidemia; Hypertension; and Thyroid disease.   She has a past surgical history that includes Abdominal hysterectomy; Appendectomy; and tosillectomy.   Her family history includes Cancer in her mother; Parkinson's disease in her father.She reports that she has never smoked. She has never used smokeless tobacco. She reports that she does not drink alcohol or use drugs.    ROS Review of Systems  Constitutional: Negative for activity change, appetite change and fever.  HENT: Negative for congestion, rhinorrhea and sore throat.   Eyes: Negative for visual disturbance.  Respiratory: Negative for cough and shortness of breath.   Cardiovascular: Negative for chest pain and palpitations.  Gastrointestinal: Negative for abdominal pain, diarrhea and nausea.  Genitourinary: Negative for dysuria.  Musculoskeletal: Negative for arthralgias and myalgias.    Objective:  BP (!) 180/85   Pulse 87   Temp 98.3 F (36.8 C) (Oral)   Ht 5\' 4"  (1.626 m)   Wt 144 lb (65.3 kg)   BMI 24.72 kg/m   BP Readings from  Last 3 Encounters:  06/27/17 (!) 180/85  07/28/16 129/80  05/26/16 130/79    Wt Readings from Last 3 Encounters:  06/27/17 144 lb (65.3 kg)  07/28/16 139 lb 12.8 oz (63.4 kg)  05/26/16 140 lb 3.2 oz (63.6 kg)     Physical Exam  Constitutional: She is oriented to person, place, and time. She appears well-developed and well-nourished. No distress.  HENT:  Head: Normocephalic and atraumatic.  Right Ear: External ear normal.  Left Ear: External ear normal.  Nose: Nose normal.  Mouth/Throat: Oropharynx is clear and moist.  Eyes: Pupils are equal, round, and reactive to light. Conjunctivae and EOM are normal.  Neck: Normal range of motion. Neck supple. No thyromegaly present.  Cardiovascular: Normal rate, regular rhythm and normal heart sounds.   No murmur heard. Pulmonary/Chest: Effort normal and breath sounds normal. No respiratory distress. She has no wheezes. She has no rales.  Abdominal: Soft. Bowel sounds are normal. She exhibits no distension. There is no tenderness.  Musculoskeletal: She exhibits tenderness (Palpable spasm in the lower right lumbar region).  Lymphadenopathy:    She has no cervical adenopathy.  Neurological: She is alert and oriented to person, place, and time. She has normal reflexes.  Skin: Skin is warm and dry.  Psychiatric: She has a normal mood and affect. Her behavior is normal. Judgment and thought content normal.      Assessment & Plan:   Shuntavia was seen today for  back pain.  Diagnoses and all orders for this visit:  Lumbar pain with radiation down right leg  Insomnia, unspecified type -     eszopiclone (LUNESTA) 2 MG TABS tablet; Take 1 tablet (2 mg total) by mouth at bedtime as needed. Take immediately before bedtime  Other orders -     predniSONE (DELTASONE) 10 MG tablet; Take 5 daily for 3 days followed by 4,3,2 and 1 for 3 days each.       I have discontinued Ms. Verba's Probiotic Product (SOLUBLE FIBER/PROBIOTICS PO). I am also  having her start on predniSONE. Additionally, I am having her maintain her Vitamin D-3, fish oil-omega-3 fatty acids, multivitamin-lutein, Glucosamine HCl, Co Q 10, vitamin E, Turmeric, Biotin, calcium-vitamin D, levothyroxine, atenolol, and eszopiclone.  Allergies as of 06/27/2017      Reactions   Penicillins    Statins    Muscle problems   Sulfonamide Derivatives       Medication List       Accurate as of 06/27/17 11:59 PM. Always use your most recent med list.          atenolol 50 MG tablet Commonly known as:  TENORMIN Take 1 Tablet by mouth once daily   Biotin 1000 MCG tablet Take 1,000 mcg by mouth daily.   CALCIUM 500/D 500-200 MG-UNIT tablet Generic drug:  calcium-vitamin D Take 1 tablet by mouth daily with breakfast.   Co Q 10 100 MG Caps Take 1 capsule by mouth daily.   eszopiclone 2 MG Tabs tablet Commonly known as:  LUNESTA Take 1 tablet (2 mg total) by mouth at bedtime as needed. Take immediately before bedtime   fish oil-omega-3 fatty acids 1000 MG capsule Take 1 g by mouth.   Glucosamine HCl 1000 MG Tabs Take 1 tablet by mouth daily.   levothyroxine 100 MCG tablet Commonly known as:  SYNTHROID, LEVOTHROID Take 1 tablet (100 mcg total) by mouth daily before breakfast.   multivitamin-lutein Caps capsule Take 1 capsule by mouth daily.   predniSONE 10 MG tablet Commonly known as:  DELTASONE Take 5 daily for 3 days followed by 4,3,2 and 1 for 3 days each.   Turmeric 500 MG Caps Take 1 capsule by mouth daily.   Vitamin D-3 1000 units Caps Take 1 capsule by mouth daily.   vitamin E 400 UNIT capsule Take 400 Units by mouth daily.      Although patient has not needed her Johnnye Sima lately, she is quite concerned that the prednisone will keep her awake at night and asked for a refill of her Lunesta.  Follow-up: Return if symptoms worsen or fail to improve.  Claretta Fraise, M.D.

## 2017-07-17 ENCOUNTER — Other Ambulatory Visit: Payer: Self-pay | Admitting: Family Medicine

## 2017-07-17 DIAGNOSIS — Z1231 Encounter for screening mammogram for malignant neoplasm of breast: Secondary | ICD-10-CM

## 2017-08-01 ENCOUNTER — Encounter: Payer: Self-pay | Admitting: Family Medicine

## 2017-08-01 ENCOUNTER — Ambulatory Visit (INDEPENDENT_AMBULATORY_CARE_PROVIDER_SITE_OTHER): Payer: Medicare HMO | Admitting: Family Medicine

## 2017-08-01 VITALS — BP 159/72 | HR 74 | Temp 97.5°F | Ht 64.0 in | Wt 142.5 lb

## 2017-08-01 DIAGNOSIS — E039 Hypothyroidism, unspecified: Secondary | ICD-10-CM

## 2017-08-01 DIAGNOSIS — M545 Low back pain, unspecified: Secondary | ICD-10-CM

## 2017-08-01 DIAGNOSIS — Z0000 Encounter for general adult medical examination without abnormal findings: Secondary | ICD-10-CM

## 2017-08-01 DIAGNOSIS — M79604 Pain in right leg: Secondary | ICD-10-CM

## 2017-08-01 DIAGNOSIS — I10 Essential (primary) hypertension: Secondary | ICD-10-CM

## 2017-08-01 DIAGNOSIS — E782 Mixed hyperlipidemia: Secondary | ICD-10-CM

## 2017-08-01 MED ORDER — CYCLOBENZAPRINE HCL 10 MG PO TABS: 10.0000 mg | ORAL_TABLET | Freq: Three times a day (TID) | ORAL | 0 refills | Status: DC | PRN

## 2017-08-01 MED ORDER — ATENOLOL 50 MG PO TABS: 50.0000 mg | ORAL_TABLET | Freq: Every day | ORAL | 0 refills | Status: DC

## 2017-08-01 NOTE — Progress Notes (Signed)
BP (!) 163/80   Pulse 74   Temp (!) 97.5 F (36.4 C) (Oral)   Ht 5' 4" (1.626 m)   Wt 142 lb 8 oz (64.6 kg)   BMI 24.46 kg/m    Subjective:    Patient ID: [name], female    DOB: 1941-11-03, 76 y.o.   MRN: [id number]  HPI: [name] is a 76 y.o. female presenting on 08/01/2017 for Annual Exam (patient is not fasting)   HPI Adult well exam Patient is coming in today for an adult well exam and recheck of her thyroid and blood pressure and cholesterol. She is not fasting today but will come in later to do the blood work. Her blood pressure in office today is 163/80 but at home she is consistently getting levels in the 120s and 110s over 60-80. Patient denies any chest pain, shortness of breath, headaches or vision issues, abdominal complaints, diarrhea, nausea, vomiting, or joint issues. She is still complaining of sciatic pain on the right side. She took some prednisone and that did help and she is doing a lot better but still has some issues at night with it waking her up.  Relevant past medical, surgical, family and social history reviewed and updated as indicated. Interim medical history since our last visit reviewed. Allergies and medications reviewed and updated.  Review of Systems  Constitutional: Negative for chills and fever.  HENT: Negative for ear pain and tinnitus.   Eyes: Negative for pain.  Respiratory: Negative for cough, shortness of breath and wheezing.   Cardiovascular: Negative for chest pain, palpitations and leg swelling.  Gastrointestinal: Negative for abdominal pain, blood in stool, constipation and diarrhea.  Genitourinary: Negative for dysuria and hematuria.  Musculoskeletal: Positive for arthralgias and back pain. Negative for myalgias.  Skin: Negative for rash.  Neurological: Negative for dizziness, weakness and headaches.  Psychiatric/Behavioral: Negative for suicidal ideas.    Per HPI unless specifically indicated above      Objective:    BP (!) 163/80   Pulse 74   Temp (!) 97.5 F (36.4 C) (Oral)   Ht 5' 4" (1.626 m)   Wt 142 lb 8 oz (64.6 kg)   BMI 24.46 kg/m   Wt Readings from Last 3 Encounters:  08/01/17 142 lb 8 oz (64.6 kg)  06/27/17 144 lb (65.3 kg)  07/28/16 139 lb 12.8 oz (63.4 kg)    Physical Exam  Constitutional: She is oriented to person, place, and time. She appears well-developed and well-nourished. No distress.  Eyes: Conjunctivae are normal.  Neck: Neck supple. No thyromegaly present.  Cardiovascular: Normal rate, regular rhythm, normal heart sounds and intact distal pulses.   No murmur heard. Pulmonary/Chest: Effort normal and breath sounds normal. No respiratory distress. She has no wheezes. She has no rales.  Abdominal: Soft. Bowel sounds are normal. She exhibits no distension. There is no tenderness. There is no rebound and no guarding.  Musculoskeletal: Normal range of motion. She exhibits no edema or tenderness (Non-reproducible low back pain on the right side, negative straight leg raise bilaterally).  Lymphadenopathy:    She has no cervical adenopathy.  Neurological: She is alert and oriented to person, place, and time. Coordination normal.  Skin: Skin is warm and dry. No rash noted. She is not diaphoretic.  Psychiatric: She has a normal mood and affect. Her behavior is normal.  Nursing note and vitals reviewed.     Assessment & Plan:   Problem List Items Addressed This Visit  Cardiovascular and Mediastinum   Essential hypertension    Blood pressure elevated in the office, has normal BPs at home      Relevant Medications   atenolol (TENORMIN) 50 MG tablet   Other Relevant Orders   CMP14+EGFR     Endocrine   Hypothyroidism   Relevant Medications   atenolol (TENORMIN) 50 MG tablet   Other Relevant Orders   TSH     Other   Hyperlipidemia   Relevant Medications   Omega-3 Fatty Acids (FISH OIL) 600 MG CAPS   atenolol (TENORMIN) 50 MG tablet   Other Relevant Orders    Lipid panel    Other Visit Diagnoses    Well adult exam    -  Primary   Relevant Orders   TSH   Lipid panel   CMP14+EGFR   Lumbar pain with radiation down right leg       Relevant Medications   cyclobenzaprine (FLEXERIL) 10 MG tablet       Follow up plan: Return in about 1 year (around 08/01/2018), or if symptoms worsen or fail to improve.  Counseling provided for all of the vaccine components Orders Placed This Encounter  Procedures  . TSH  . Lipid panel  . Mason, MD [facility] 08/01/2017, 11:01 AM

## 2017-08-01 NOTE — Assessment & Plan Note (Signed)
Blood pressure elevated in the office, has normal BPs at home

## 2017-08-22 ENCOUNTER — Other Ambulatory Visit: Payer: Medicare HMO

## 2017-08-22 DIAGNOSIS — E782 Mixed hyperlipidemia: Secondary | ICD-10-CM

## 2017-08-22 DIAGNOSIS — I10 Essential (primary) hypertension: Secondary | ICD-10-CM | POA: Diagnosis not present

## 2017-08-22 DIAGNOSIS — Z0000 Encounter for general adult medical examination without abnormal findings: Secondary | ICD-10-CM | POA: Diagnosis not present

## 2017-08-22 DIAGNOSIS — E039 Hypothyroidism, unspecified: Secondary | ICD-10-CM | POA: Diagnosis not present

## 2017-08-22 DIAGNOSIS — R7309 Other abnormal glucose: Secondary | ICD-10-CM

## 2017-08-23 ENCOUNTER — Other Ambulatory Visit: Payer: Self-pay

## 2017-08-23 DIAGNOSIS — R7309 Other abnormal glucose: Secondary | ICD-10-CM

## 2017-08-23 DIAGNOSIS — I10 Essential (primary) hypertension: Secondary | ICD-10-CM

## 2017-08-23 DIAGNOSIS — E039 Hypothyroidism, unspecified: Secondary | ICD-10-CM

## 2017-08-23 LAB — LIPID PANEL
CHOL/HDL RATIO: 5.2 ratio — AB (ref 0.0–4.4)
CHOLESTEROL TOTAL: 222 mg/dL — AB (ref 100–199)
HDL: 43 mg/dL (ref >39)
LDL Calculated: 147 mg/dL — ABNORMAL HIGH (ref 0–99)
Triglycerides: 162 mg/dL — ABNORMAL HIGH (ref 0–149)
VLDL CHOLESTEROL CAL: 32 mg/dL (ref 5–40)

## 2017-08-23 LAB — CMP14+EGFR
A/G RATIO: 1.5 (ref 1.2–2.2)
ALK PHOS: 49 IU/L (ref 39–117)
ALT: 16 IU/L (ref 0–32)
AST: 21 IU/L (ref 0–40)
Albumin: 4.1 g/dL (ref 3.5–4.8)
BILIRUBIN TOTAL: 0.7 mg/dL (ref 0.0–1.2)
BUN/Creatinine Ratio: 12 (ref 12–28)
BUN: 9 mg/dL (ref 8–27)
CALCIUM: 9.2 mg/dL (ref 8.7–10.3)
CHLORIDE: 100 mmol/L (ref 96–106)
CO2: 24 mmol/L (ref 20–29)
Creatinine, Ser: 0.75 mg/dL (ref 0.57–1.00)
GFR calc Af Amer: 89 mL/min/{1.73_m2} (ref >59)
GFR calc non Af Amer: 77 mL/min/{1.73_m2} (ref >59)
GLOBULIN, TOTAL: 2.7 g/dL (ref 1.5–4.5)
Glucose: 109 mg/dL — ABNORMAL HIGH (ref 65–99)
POTASSIUM: 4.4 mmol/L (ref 3.5–5.2)
SODIUM: 141 mmol/L (ref 134–144)
Total Protein: 6.8 g/dL (ref 6.0–8.5)

## 2017-08-23 LAB — TSH: TSH: 2.46 u[IU]/mL (ref 0.450–4.500)

## 2017-08-23 LAB — BAYER DCA HB A1C WAIVED: HB A1C (BAYER DCA - WAIVED): 5.8 % (ref <7.0)

## 2017-08-23 MED ORDER — LEVOTHYROXINE SODIUM 100 MCG PO TABS: 100.0000 ug | ORAL_TABLET | Freq: Every day | ORAL | 3 refills | Status: DC

## 2017-08-23 MED ORDER — ATENOLOL 50 MG PO TABS: 50.0000 mg | ORAL_TABLET | Freq: Every day | ORAL | 3 refills | Status: DC

## 2017-08-31 ENCOUNTER — Ambulatory Visit
Admission: RE | Admit: 2017-08-31 | Discharge: 2017-08-31 | Disposition: A | Discharge status: Home / Self Care | Payer: Medicare HMO | Source: Ambulatory Visit | Attending: Family Medicine | Admitting: Family Medicine

## 2017-08-31 DIAGNOSIS — Z1231 Encounter for screening mammogram for malignant neoplasm of breast: Secondary | ICD-10-CM | POA: Diagnosis not present

## 2017-10-10 DIAGNOSIS — R69 Illness, unspecified: Secondary | ICD-10-CM | POA: Diagnosis not present

## 2017-11-30 ENCOUNTER — Encounter: Payer: Self-pay | Admitting: Family Medicine

## 2017-11-30 ENCOUNTER — Ambulatory Visit: Payer: Medicare HMO | Admitting: Family Medicine

## 2017-11-30 VITALS — BP 136/83 | HR 78 | Temp 97.3°F | Ht 64.0 in | Wt 145.0 lb

## 2017-11-30 DIAGNOSIS — R238 Other skin changes: Secondary | ICD-10-CM | POA: Diagnosis not present

## 2017-11-30 MED ORDER — PREDNISONE 20 MG PO TABS: ORAL_TABLET | ORAL | 0 refills | Status: DC

## 2017-11-30 MED ORDER — FLUCONAZOLE 150 MG PO TABS: 150.0000 mg | ORAL_TABLET | ORAL | 0 refills | Status: DC

## 2017-11-30 NOTE — Progress Notes (Signed)
   BP 136/83   Pulse 78   Temp (!) 97.3 F (36.3 C) (Oral)   Ht 5\' 4"  (1.626 m)   Wt 145 lb (65.8 kg)   BMI 24.89 kg/m    Subjective:    Patient ID: [name], female    DOB: 1941-08-09, 76 y.o.   MRN: [id number]  HPI: [name] is a 76 y.o. female presenting on 11/30/2017 for itchy, dry scalp   HPI Dry scalp and itchy Patient has been having dry and itchy scalp that is been going on for 4 weeks.  She has tried to check in on any changes in hair products or other things in her lifestyle and cannot pinpoint any changes.  She is even seen her hairdresser who could not give a good answer for what is going on in her scalp.  She denies any drainage or pain associated with it but just gets very irritated and pruritic at times.  She says she is noticed little red bumps when other people of look at her scalp for her.  She has been trying to use some tea tree oil which is helped a little bit but not significantly.  She denies any fevers or chills or rash anywhere else on her body besides her scalp.  Relevant past medical, surgical, family and social history reviewed and updated as indicated. Interim medical history since our last visit reviewed. Allergies and medications reviewed and updated.  Review of Systems  Constitutional: Negative for chills and fever.  Eyes: Negative for visual disturbance.  Respiratory: Negative for chest tightness and shortness of breath.   Cardiovascular: Negative for chest pain and leg swelling.  Skin: Positive for rash.  Neurological: Negative for headaches.  All other systems reviewed and are negative.   Per HPI unless specifically indicated above        Objective:    BP 136/83   Pulse 78   Temp (!) 97.3 F (36.3 C) (Oral)   Ht 5\' 4"  (1.626 m)   Wt 145 lb (65.8 kg)   BMI 24.89 kg/m   Wt Readings from Last 3 Encounters:  11/30/17 145 lb (65.8 kg)  08/01/17 142 lb 8 oz (64.6 kg)  06/27/17 144 lb (65.3 kg)    Physical Exam    Constitutional: She is oriented to person, place, and time. She appears well-developed and well-nourished. No distress.  Eyes: Conjunctivae are normal.  Musculoskeletal: Normal range of motion.  Neurological: She is alert and oriented to person, place, and time.  Skin: Skin is warm and dry. Rash (Papular rash with scaliness in her scalp) noted. She is not diaphoretic.  Psychiatric: She has a normal mood and affect. Her behavior is normal.  Nursing note and vitals reviewed.     Assessment & Plan:   Problem List Items Addressed This Visit    None    Visit Diagnoses    Dry scalp    -  Primary   Relevant Medications   predniSONE (DELTASONE) 20 MG tablet   fluconazole (DIFLUCAN) 150 MG tablet       Follow up plan: Return if symptoms worsen or fail to improve.  Counseling provided for all of the vaccine components No orders of the defined types were placed in this encounter.   Caryl Pina, MD [facility] 11/30/2017, 2:48 PM

## 2017-12-21 DIAGNOSIS — L218 Other seborrheic dermatitis: Secondary | ICD-10-CM | POA: Diagnosis not present

## 2018-02-26 ENCOUNTER — Ambulatory Visit (INDEPENDENT_AMBULATORY_CARE_PROVIDER_SITE_OTHER): Payer: Medicare HMO | Admitting: Family Medicine

## 2018-02-26 ENCOUNTER — Encounter: Payer: Self-pay | Admitting: Family Medicine

## 2018-02-26 VITALS — BP 135/80 | HR 105 | Temp 98.0°F | Ht 64.0 in | Wt 144.0 lb

## 2018-02-26 DIAGNOSIS — M5431 Sciatica, right side: Secondary | ICD-10-CM | POA: Diagnosis not present

## 2018-02-26 DIAGNOSIS — T753XXA Motion sickness, initial encounter: Secondary | ICD-10-CM

## 2018-02-26 MED ORDER — SCOPOLAMINE 1 MG/3DAYS TD PT72: 1.0000 | MEDICATED_PATCH | TRANSDERMAL | 12 refills | Status: DC

## 2018-02-26 MED ORDER — CYCLOBENZAPRINE HCL 10 MG PO TABS: 10.0000 mg | ORAL_TABLET | Freq: Three times a day (TID) | ORAL | 0 refills | Status: DC | PRN

## 2018-02-26 MED ORDER — PREDNISONE 20 MG PO TABS: ORAL_TABLET | ORAL | 0 refills | Status: DC

## 2018-02-26 NOTE — Progress Notes (Signed)
BP 135/80   Pulse (!) 105   Temp 98 F (36.7 C) (Oral)   Ht 5\' 4"  (1.626 m)   Wt 144 lb (65.3 kg)   BMI 24.72 kg/m    Subjective:    Patient ID: [name], female    DOB: June 15, 1941, 77 y.o.   MRN: [id number]  HPI: [name] is a 77 y.o. female presenting on 02/26/2018 for Going on an [place] cruise, needs Scopalmine patches and Leg pain, happens during night (history of sciatic nerve pain)   HPI Pt presents with right sided low back pain x 2-3 weeks that radiates from hip to knee. She states that it wakes her up around 3 AM every morning, but does not hurt during the daytime. Denies any trauma. Stated she had a similar pain last July after moving furniture, which completely resolved with Prednisone and Flexeril, but symptoms are the same and she does not know if this is the same injury or a new one. She is using heat and ice, ibuprofen occasionally, icy hot, and a pillow under her knees, which seems to help but she is still waking up in the middle of the night with the pain. Negative straight leg raise. Denies night sweats or wt loss. Pt would also like Scopolamine patches for her [place] cruise.  Relevant past medical, surgical, family and social history reviewed and updated as indicated. Interim medical history since our last visit reviewed. Allergies and medications reviewed and updated.  Review of Systems  Constitutional: Negative for activity change, appetite change, fatigue and fever.  HENT: Negative for congestion, ear discharge and rhinorrhea.   Respiratory: Negative for cough and shortness of breath.   Gastrointestinal: Negative for abdominal pain, nausea and vomiting.  Genitourinary: Negative for difficulty urinating, flank pain and urgency.  Musculoskeletal: Positive for back pain (right sided lumbar pain) and myalgias (low back pain, only at night). Negative for gait problem and joint swelling.  Skin: Negative for rash and wound.  Neurological: Negative for  dizziness, weakness and light-headedness.    Per HPI unless specifically indicated above   Allergies as of 02/26/2018      Reactions   Penicillins    Statins    Muscle problems   Sulfonamide Derivatives       Medication List        Accurate as of 02/26/18 11:55 AM. Always use your most recent med list.          atenolol 50 MG tablet Commonly known as:  TENORMIN Take 1 tablet (50 mg total) by mouth daily.   clobetasol 0.05 % external solution Commonly known as:  TEMOVATE Apply 1 application topically 2 (two) times daily.   Co Q 10 100 MG Caps Take 1 capsule by mouth daily.   cyclobenzaprine 10 MG tablet Commonly known as:  FLEXERIL Take 1 tablet (10 mg total) by mouth 3 (three) times daily as needed for muscle spasms.   eszopiclone 2 MG Tabs tablet Commonly known as:  LUNESTA Take 1 tablet (2 mg total) by mouth at bedtime as needed. Take immediately before bedtime   Fish Oil 600 MG Caps Take 600 mg by mouth daily.   fluconazole 150 MG tablet Commonly known as:  DIFLUCAN Take 1 tablet (150 mg total) by mouth once a week.   GLUCOSAMINE CHONDR COMPLEX PO Take 600 mg by mouth daily.   levothyroxine 100 MCG tablet Commonly known as:  SYNTHROID, LEVOTHROID Take 1 tablet (100 mcg total) by mouth daily before breakfast.  multivitamin-lutein Caps capsule Take 1 capsule by mouth daily.   OSCAL 500/200 D-3 PO Take 1 capsule by mouth. Daily at lunchtime   predniSONE 20 MG tablet Commonly known as:  DELTASONE 2 po at same time daily for 5 days   scopolamine 1 MG/3DAYS Commonly known as:  TRANSDERM-SCOP (1.5 MG) Place 1 patch (1.5 mg total) onto the skin every 3 (three) days.   Turmeric 500 MG Caps Take 1 capsule by mouth daily.   Vitamin D 2000 units Caps Take 1 capsule by mouth daily.   vitamin E 400 UNIT capsule Take 400 Units by mouth. Three times a week          Objective:    BP 135/80   Pulse (!) 105   Temp 98 F (36.7 C) (Oral)   Ht 5\' 4"   (1.626 m)   Wt 144 lb (65.3 kg)   BMI 24.72 kg/m     Physical Exam  Constitutional: She is oriented to person, place, and time. She appears well-developed and well-nourished.  Cardiovascular: Normal rate, regular rhythm and normal heart sounds.  Pulmonary/Chest: Effort normal and breath sounds normal. No respiratory distress. She has no wheezes. She has no rales.  Abdominal: Soft. She exhibits no distension. There is no tenderness. There is no rebound.  Musculoskeletal:       Thoracic back: Normal.       Lumbar back: She exhibits no tenderness (pt complains of tenderness at night, non-reproducible at this time), no bony tenderness and no spasm (complains of radiation from hip to knee).       Right upper leg: Normal.       Left upper leg: Normal.  Neurological: She is alert and oriented to person, place, and time.  Nursing note and vitals reviewed.       Assessment & Plan:   Problem List Items Addressed This Visit    None    Visit Diagnoses    Right sciatic nerve pain    -  Primary   Relevant Medications   predniSONE (DELTASONE) 20 MG tablet   cyclobenzaprine (FLEXERIL) 10 MG tablet   Motion sickness, initial encounter       wants scopalamine for cruise   Relevant Medications   scopolamine (TRANSDERM-SCOP, 1.5 MG,) 1 MG/3DAYS     Order Prednisone and Flexeril for right sciatic nerve pain. Instructed pt to take Prednisone with food. Continue with heat and ice.  Also order Scopolamine for cruise.  Follow up plan: Return if symptoms worsen or fail to improve.  Counseling provided for all of the vaccine components No orders of the defined types were placed in this encounter.  Patient seen and examined with Chaney Malling PA student. Agree with assessment and plan above.  Caryl Pina, MD [facility] 03/02/2018, 8:02 AM

## 2018-02-28 ENCOUNTER — Telehealth: Payer: Self-pay

## 2018-02-28 NOTE — Telephone Encounter (Signed)
Pt aware of MD feedback. 

## 2018-02-28 NOTE — Telephone Encounter (Signed)
Please let her know she will have to negotiate price with the pharmacy.

## 2018-02-28 NOTE — Telephone Encounter (Signed)
Transderm scope denied under Medicare PArt D

## 2018-03-12 ENCOUNTER — Telehealth: Payer: Self-pay | Admitting: Family Medicine

## 2018-03-12 DIAGNOSIS — T753XXA Motion sickness, initial encounter: Secondary | ICD-10-CM

## 2018-03-12 NOTE — Telephone Encounter (Signed)
DR Dettinger - lets see if Jackelyn Poling can handle this first. She may have already received a PA form on it.  (just wanted you aware just in case)

## 2018-03-12 NOTE — Telephone Encounter (Signed)
I need her member ID to be able to call, do we have that somewhere

## 2018-03-12 NOTE — Telephone Encounter (Signed)
pt spoke with pharm - [employer]   IF DR Dettinger will call this # - they will approve as a pre-cert. [id number]  Deb, can you handle this or should he call. Needs for motion sickness for a cruise.

## 2018-05-09 DIAGNOSIS — R69 Illness, unspecified: Secondary | ICD-10-CM | POA: Diagnosis not present

## 2018-07-19 ENCOUNTER — Other Ambulatory Visit: Payer: Self-pay | Admitting: Family Medicine

## 2018-07-19 DIAGNOSIS — Z1231 Encounter for screening mammogram for malignant neoplasm of breast: Secondary | ICD-10-CM

## 2018-08-08 ENCOUNTER — Ambulatory Visit (INDEPENDENT_AMBULATORY_CARE_PROVIDER_SITE_OTHER): Payer: Medicare HMO

## 2018-08-08 ENCOUNTER — Encounter: Payer: Self-pay | Admitting: Family Medicine

## 2018-08-08 ENCOUNTER — Ambulatory Visit (INDEPENDENT_AMBULATORY_CARE_PROVIDER_SITE_OTHER): Payer: Medicare HMO | Admitting: Family Medicine

## 2018-08-08 VITALS — BP 139/71 | HR 68 | Temp 98.2°F | Ht 64.0 in | Wt 143.8 lb

## 2018-08-08 DIAGNOSIS — E782 Mixed hyperlipidemia: Secondary | ICD-10-CM | POA: Diagnosis not present

## 2018-08-08 DIAGNOSIS — Z13820 Encounter for screening for osteoporosis: Secondary | ICD-10-CM | POA: Diagnosis not present

## 2018-08-08 DIAGNOSIS — G4700 Insomnia, unspecified: Secondary | ICD-10-CM | POA: Diagnosis not present

## 2018-08-08 DIAGNOSIS — I10 Essential (primary) hypertension: Secondary | ICD-10-CM | POA: Diagnosis not present

## 2018-08-08 DIAGNOSIS — E2839 Other primary ovarian failure: Secondary | ICD-10-CM | POA: Diagnosis not present

## 2018-08-08 DIAGNOSIS — E039 Hypothyroidism, unspecified: Secondary | ICD-10-CM

## 2018-08-08 MED ORDER — ESZOPICLONE 2 MG PO TABS: 2.0000 mg | ORAL_TABLET | Freq: Every evening | ORAL | 2 refills | Status: DC | PRN

## 2018-08-08 MED ORDER — LEVOTHYROXINE SODIUM 100 MCG PO TABS: 100.0000 ug | ORAL_TABLET | Freq: Every day | ORAL | 3 refills | Status: DC

## 2018-08-08 MED ORDER — ATENOLOL 50 MG PO TABS: 50.0000 mg | ORAL_TABLET | Freq: Every day | ORAL | 3 refills | Status: DC

## 2018-08-08 NOTE — Progress Notes (Signed)
BP 139/71   Pulse 68   Temp 98.2 F (36.8 C) (Oral)   Ht '5\' 4"'$  (1.626 m)   Wt 143 lb 12.8 oz (65.2 kg)   BMI 24.68 kg/m    Subjective:    Patient ID: [name], female    DOB: 1940-12-24, 77 y.o.   MRN: [id number]  HPI: [name] is a 77 y.o. female presenting on 08/08/2018 for Annual Exam   HPI Hypertension Patient is currently on atenolol, and their blood pressure today is 139/71 and at home runs around there as well. Patient denies any lightheadedness or dizziness. Patient denies headaches, blurred vision, chest pains, shortness of breath, or weakness. Denies any side effects from medication and is content with current medication.   Hyperlipidemia Patient is coming in for recheck of his hyperlipidemia. The patient is currently taking fish oil. They deny any issues with myalgias or history of liver damage from it. They deny any focal numbness or weakness or chest pain.   Hypothyroidism recheck Patient is coming in for thyroid recheck today as well. They deny any issues with hair changes or heat or cold problems or diarrhea or constipation. They deny any chest pain or palpitations. They are currently on levothyroxine 100 micrograms   Insomnia recheck Patient is coming in for insomnia recheck.  She currently takes [place] and she only uses it infrequently like 2 or 3 times a month and says it does very well for her and she would like to continue at that rate for.  Most the time she sleeps just fine.  Relevant past medical, surgical, family and social history reviewed and updated as indicated. Interim medical history since our last visit reviewed. Allergies and medications reviewed and updated.  Review of Systems  Constitutional: Negative for chills and fever.  Eyes: Negative for visual disturbance.  Respiratory: Negative for chest tightness and shortness of breath.   Cardiovascular: Negative for chest pain and leg swelling.  Musculoskeletal: Negative for back pain and  gait problem.  Skin: Negative for rash.  Neurological: Negative for dizziness, weakness, light-headedness, numbness and headaches.  Psychiatric/Behavioral: Positive for sleep disturbance. Negative for agitation, behavioral problems, decreased concentration, dysphoric mood, self-injury and suicidal ideas.  All other systems reviewed and are negative.   Per HPI unless specifically indicated above   Allergies as of 08/08/2018      Reactions   Penicillins    Statins    Muscle problems   Sulfonamide Derivatives       Medication List        Accurate as of 08/08/18 10:40 AM. Always use your most recent med list.          atenolol 50 MG tablet Commonly known as:  TENORMIN Take 1 tablet (50 mg total) by mouth daily.   clobetasol 0.05 % external solution Commonly known as:  TEMOVATE Apply 1 application topically 2 (two) times daily.   Co Q 10 100 MG Caps Take 1 capsule by mouth daily. 3 times per week   cyclobenzaprine 10 MG tablet Commonly known as:  FLEXERIL Take 1 tablet (10 mg total) by mouth 3 (three) times daily as needed for muscle spasms.   eszopiclone 2 MG Tabs tablet Commonly known as:  LUNESTA Take 1 tablet (2 mg total) by mouth at bedtime as needed. Take immediately before bedtime   Fish Oil 600 MG Caps Take 600 mg by mouth daily. 3 times per week   GLUCOSAMINE CHONDR COMPLEX PO Take 600 mg by mouth daily.  levothyroxine 100 MCG tablet Commonly known as:  SYNTHROID, LEVOTHROID Take 1 tablet (100 mcg total) by mouth daily before breakfast.   multivitamin-lutein Caps capsule Take 1 capsule by mouth daily.   OSCAL 500/200 D-3 PO Take 1 capsule by mouth. Daily at lunchtime   scopolamine 1 MG/3DAYS Commonly known as:  TRANSDERM-SCOP Place 1 patch (1.5 mg total) onto the skin every 3 (three) days.   Turmeric 500 MG Caps Take 1 capsule by mouth daily.   Vitamin D 2000 units Caps Take 1 capsule by mouth daily.   vitamin E 400 UNIT capsule Take 400  Units by mouth. Three times a week          Objective:    BP 139/71   Pulse 68   Temp 98.2 F (36.8 C) (Oral)   Ht '5\' 4"'$  (1.626 m)   Wt 143 lb 12.8 oz (65.2 kg)   BMI 24.68 kg/m   Wt Readings from Last 3 Encounters:  08/08/18 143 lb 12.8 oz (65.2 kg)  02/26/18 144 lb (65.3 kg)  11/30/17 145 lb (65.8 kg)    Physical Exam  Constitutional: She is oriented to person, place, and time. She appears well-developed and well-nourished. No distress.  Eyes: Pupils are equal, round, and reactive to light. Conjunctivae and EOM are normal.  Neck: Neck supple. No thyromegaly present.  Cardiovascular: Normal rate, regular rhythm, normal heart sounds and intact distal pulses.  No murmur heard. Pulmonary/Chest: Effort normal and breath sounds normal. No respiratory distress. She has no wheezes.  Musculoskeletal: Normal range of motion. She exhibits no edema.  Lymphadenopathy:    She has no cervical adenopathy.  Neurological: She is alert and oriented to person, place, and time. Coordination normal.  Skin: Skin is warm and dry. No rash noted. She is not diaphoretic.  Psychiatric: She has a normal mood and affect. Her behavior is normal.  Nursing note and vitals reviewed.       Assessment & Plan:   Problem List Items Addressed This Visit      Cardiovascular and Mediastinum   Essential hypertension - Primary   Relevant Medications   atenolol (TENORMIN) 50 MG tablet   Other Relevant Orders   CMP14+EGFR   Lipid panel     Endocrine   Hypothyroidism   Relevant Medications   atenolol (TENORMIN) 50 MG tablet   levothyroxine (SYNTHROID, LEVOTHROID) 100 MCG tablet   Other Relevant Orders   TSH     Other   Hyperlipidemia   Relevant Medications   atenolol (TENORMIN) 50 MG tablet   Other Relevant Orders   Lipid panel    Other Visit Diagnoses    Insomnia, unspecified type       Relevant Medications   eszopiclone (LUNESTA) 2 MG TABS tablet   Other Relevant Orders   CBC with  Differential/Platelet   Osteoporosis screening       Relevant Orders   DG Bone Density      Continue current medication, will do bone density tests Follow up plan: Return in about 1 year (around 08/09/2019), or if symptoms worsen or fail to improve, for Pretension and cholesterol recheck.  Counseling provided for all of the vaccine components Orders Placed This Encounter  Procedures  . CBC with Differential/Platelet  . CMP14+EGFR  . Lipid panel  . TSH    Caryl Pina, MD [facility] 08/08/2018, 10:40 AM

## 2018-08-09 LAB — CBC WITH DIFFERENTIAL/PLATELET
BASOS: 1 %
Basophils Absolute: 0.1 10*3/uL (ref 0.0–0.2)
EOS (ABSOLUTE): 0.2 10*3/uL (ref 0.0–0.4)
Eos: 4 %
HEMATOCRIT: 46.4 % (ref 34.0–46.6)
Hemoglobin: 15.5 g/dL (ref 11.1–15.9)
IMMATURE GRANS (ABS): 0 10*3/uL (ref 0.0–0.1)
Immature Granulocytes: 0 %
LYMPHS: 27 %
Lymphocytes Absolute: 1.7 10*3/uL (ref 0.7–3.1)
MCH: 30.1 pg (ref 26.6–33.0)
MCHC: 33.4 g/dL (ref 31.5–35.7)
MCV: 90 fL (ref 79–97)
MONOS ABS: 0.5 10*3/uL (ref 0.1–0.9)
Monocytes: 8 %
Neutrophils Absolute: 3.9 10*3/uL (ref 1.4–7.0)
Neutrophils: 60 %
PLATELETS: 296 10*3/uL (ref 150–450)
RBC: 5.15 x10E6/uL (ref 3.77–5.28)
RDW: 12.7 % (ref 12.3–15.4)
WBC: 6.4 10*3/uL (ref 3.4–10.8)

## 2018-08-09 LAB — LIPID PANEL
CHOLESTEROL TOTAL: 223 mg/dL — AB (ref 100–199)
Chol/HDL Ratio: 5.2 ratio — ABNORMAL HIGH (ref 0.0–4.4)
HDL: 43 mg/dL (ref >39)
LDL Calculated: 152 mg/dL — ABNORMAL HIGH (ref 0–99)
TRIGLYCERIDES: 142 mg/dL (ref 0–149)
VLDL CHOLESTEROL CAL: 28 mg/dL (ref 5–40)

## 2018-08-09 LAB — CMP14+EGFR
A/G RATIO: 1.7 (ref 1.2–2.2)
ALT: 15 IU/L (ref 0–32)
AST: 16 IU/L (ref 0–40)
Albumin: 4.2 g/dL (ref 3.5–4.8)
Alkaline Phosphatase: 53 IU/L (ref 39–117)
BILIRUBIN TOTAL: 0.7 mg/dL (ref 0.0–1.2)
BUN/Creatinine Ratio: 16 (ref 12–28)
BUN: 11 mg/dL (ref 8–27)
CO2: 27 mmol/L (ref 20–29)
Calcium: 9.1 mg/dL (ref 8.7–10.3)
Chloride: 99 mmol/L (ref 96–106)
Creatinine, Ser: 0.69 mg/dL (ref 0.57–1.00)
GFR calc Af Amer: 96 mL/min/{1.73_m2} (ref >59)
GFR, EST NON AFRICAN AMERICAN: 84 mL/min/{1.73_m2} (ref >59)
GLOBULIN, TOTAL: 2.5 g/dL (ref 1.5–4.5)
Glucose: 106 mg/dL — ABNORMAL HIGH (ref 65–99)
Potassium: 4.4 mmol/L (ref 3.5–5.2)
Sodium: 142 mmol/L (ref 134–144)
TOTAL PROTEIN: 6.7 g/dL (ref 6.0–8.5)

## 2018-08-09 LAB — TSH: TSH: 3.36 u[IU]/mL (ref 0.450–4.500)

## 2018-09-13 ENCOUNTER — Ambulatory Visit
Admission: RE | Admit: 2018-09-13 | Discharge: 2018-09-13 | Disposition: A | Discharge status: Home / Self Care | Payer: Medicare HMO | Source: Ambulatory Visit | Attending: Family Medicine | Admitting: Family Medicine

## 2018-09-13 DIAGNOSIS — Z1231 Encounter for screening mammogram for malignant neoplasm of breast: Secondary | ICD-10-CM

## 2018-09-25 ENCOUNTER — Ambulatory Visit: Payer: Medicare HMO | Admitting: *Deleted

## 2018-09-25 VITALS — BP 197/83 | HR 74

## 2018-09-25 DIAGNOSIS — Z0130 Encounter for examination of blood pressure without abnormal findings: Secondary | ICD-10-CM

## 2018-09-25 NOTE — Progress Notes (Signed)
Pt here for BP check Elevated readings Per Dr Warrick Parisian, pt scheduled for evaluation

## 2018-09-26 ENCOUNTER — Encounter: Payer: Self-pay | Admitting: Family Medicine

## 2018-09-26 ENCOUNTER — Ambulatory Visit (INDEPENDENT_AMBULATORY_CARE_PROVIDER_SITE_OTHER): Payer: Medicare HMO | Admitting: Family Medicine

## 2018-09-26 VITALS — BP 175/85 | HR 82 | Temp 97.1°F | Ht 64.0 in | Wt 145.6 lb

## 2018-09-26 DIAGNOSIS — I10 Essential (primary) hypertension: Secondary | ICD-10-CM

## 2018-09-26 MED ORDER — AMLODIPINE BESYLATE 2.5 MG PO TABS: 2.5000 mg | ORAL_TABLET | Freq: Every day | ORAL | 3 refills | Status: DC

## 2018-09-26 NOTE — Progress Notes (Signed)
BP (!) 175/85   Pulse 82   Temp (!) 97.1 F (36.2 C) (Oral)   Ht 5\' 4"  (1.626 m)   Wt 145 lb 9.6 oz (66 kg)   BMI 24.99 kg/m    Subjective:    Patient ID: [name], female    DOB: [birth year], 77 y.o.   MRN: [id number]  HPI: [name] is a 77 y.o. female presenting on 09/26/2018 for Hypertension (yesterday- 154/85, 168/94, 171/91, 198/95)   HPI Hypertension Patient is currently on atenolol, and their blood pressure today is 175/85, she has been get home blood pressure readings of 145/85 and 168/94 and 171/91 and 198/95.  He does not show that it is issues until just over the past few weeks.  She denies changing anything else that would have brought this upon her.. Patient denies any lightheadedness or dizziness. Patient denies headaches, blurred vision, chest pains, shortness of breath, or weakness. Denies any side effects from medication and is content with current medication.   Relevant past medical, surgical, family and social history reviewed and updated as indicated. Interim medical history since our last visit reviewed. Allergies and medications reviewed and updated.  Review of Systems  Constitutional: Negative for chills and fever.  Eyes: Negative for redness and visual disturbance.  Respiratory: Negative for chest tightness and shortness of breath.   Cardiovascular: Negative for chest pain and leg swelling.  Musculoskeletal: Negative for back pain and gait problem.  Skin: Negative for rash.  Neurological: Negative for dizziness, weakness, light-headedness, numbness and headaches.  Psychiatric/Behavioral: Negative for agitation and behavioral problems.  All other systems reviewed and are negative.   Per HPI unless specifically indicated above   Allergies as of 09/26/2018      Reactions   Penicillins    Statins    Muscle problems   Sulfonamide Derivatives       Medication List        Accurate as of 09/26/18  4:21 PM. Always use your most recent med  list.          amLODipine 2.5 MG tablet Commonly known as:  NORVASC Take 1 tablet (2.5 mg total) by mouth daily.   atenolol 50 MG tablet Commonly known as:  TENORMIN Take 1 tablet (50 mg total) by mouth daily.   clobetasol 0.05 % external solution Commonly known as:  TEMOVATE Apply 1 application topically 2 (two) times daily.   Co Q 10 100 MG Caps Take 1 capsule by mouth daily. 3 times per week   cyclobenzaprine 10 MG tablet Commonly known as:  FLEXERIL Take 1 tablet (10 mg total) by mouth 3 (three) times daily as needed for muscle spasms.   eszopiclone 2 MG Tabs tablet Commonly known as:  LUNESTA Take 1 tablet (2 mg total) by mouth at bedtime as needed. Take immediately before bedtime   Fish Oil 600 MG Caps Take 600 mg by mouth daily. 3 times per week   GLUCOSAMINE CHONDR COMPLEX PO Take 600 mg by mouth daily.   levothyroxine 100 MCG tablet Commonly known as:  SYNTHROID, LEVOTHROID Take 1 tablet (100 mcg total) by mouth daily before breakfast.   multivitamin-lutein Caps capsule Take 1 capsule by mouth daily.   OSCAL 500/200 D-3 PO Take 1 capsule by mouth. Daily at lunchtime   scopolamine 1 MG/3DAYS Commonly known as:  TRANSDERM-SCOP Place 1 patch (1.5 mg total) onto the skin every 3 (three) days.   Turmeric 500 MG Caps Take 1 capsule by mouth daily.  Vitamin D 2000 units Caps Take 1 capsule by mouth daily.   vitamin E 400 UNIT capsule Take 400 Units by mouth. Three times a week          Objective:    BP (!) 175/85   Pulse 82   Temp (!) 97.1 F (36.2 C) (Oral)   Ht 5\' 4"  (1.626 m)   Wt 145 lb 9.6 oz (66 kg)   BMI 24.99 kg/m   Wt Readings from Last 3 Encounters:  09/26/18 145 lb 9.6 oz (66 kg)  08/08/18 143 lb 12.8 oz (65.2 kg)  02/26/18 144 lb (65.3 kg)    Physical Exam  Constitutional: She is oriented to person, place, and time. She appears well-developed and well-nourished. No distress.  Eyes: Conjunctivae are normal.  Cardiovascular:  Normal rate, regular rhythm, normal heart sounds and intact distal pulses.  No murmur heard. Pulmonary/Chest: Effort normal and breath sounds normal. No respiratory distress. She has no wheezes.  Neurological: She is alert and oriented to person, place, and time. Coordination normal.  Skin: Skin is warm and dry. No rash noted. She is not diaphoretic.  Psychiatric: She has a normal mood and affect. Her behavior is normal.  Nursing note and vitals reviewed.       Assessment & Plan:   Problem List Items Addressed This Visit      Cardiovascular and Mediastinum   Essential hypertension - Primary   Relevant Medications   amLODipine (NORVASC) 2.5 MG tablet      Added amlodipine to her regiment, continue atenolol. Follow up plan: Return in about 8 weeks (around 11/21/2018), or if symptoms worsen or fail to improve, for Recheck blood pressure in 4 to 8 weeks.  Counseling provided for all of the vaccine components No orders of the defined types were placed in this encounter.   Caryl Pina, MD [facility] 09/26/2018, 4:21 PM

## 2018-12-18 ENCOUNTER — Ambulatory Visit (INDEPENDENT_AMBULATORY_CARE_PROVIDER_SITE_OTHER): Payer: Medicare HMO | Admitting: Family Medicine

## 2018-12-18 ENCOUNTER — Encounter: Payer: Self-pay | Admitting: Family Medicine

## 2018-12-18 VITALS — BP 132/84 | HR 90 | Temp 97.6°F | Ht 64.0 in | Wt 144.0 lb

## 2018-12-18 DIAGNOSIS — H9313 Tinnitus, bilateral: Secondary | ICD-10-CM

## 2018-12-18 DIAGNOSIS — H6503 Acute serous otitis media, bilateral: Secondary | ICD-10-CM | POA: Diagnosis not present

## 2018-12-18 DIAGNOSIS — J069 Acute upper respiratory infection, unspecified: Secondary | ICD-10-CM

## 2018-12-18 MED ORDER — BENZONATATE 100 MG PO CAPS: 100.0000 mg | ORAL_CAPSULE | Freq: Three times a day (TID) | ORAL | 0 refills | Status: DC | PRN

## 2018-12-18 NOTE — Patient Instructions (Signed)

## 2018-12-18 NOTE — Progress Notes (Signed)
Subjective:    Patient ID: [name], female    DOB: 14-Oct-1941, 78 y.o.   MRN: [id number]  Chief Complaint:  Discuss Amlodipine (reports ringing in ears since she started medication) and URI (congestion, cough; taking Coricidin HBP but it elevates her blood pressure)   HPI: [name] is a 78 y.o. female presenting on 12/18/2018 for Discuss Amlodipine (reports ringing in ears since she started medication) and URI (congestion, cough; taking Coricidin HBP but it elevates her blood pressure)  Pt presents today for tinnitus, cough, congestion, and postnasal drainage. Pt states the tinnitus started shortly after starting the amlodipine. States it was loud at first, but has improved over the last few days. Pt states she has had a cough, congestion, rhinorrhea, and postnasal drainage for several days. States she was treating with over the counter Coricidin HBP but noticed an increase in her blood pressure so she stopped taking it. BP was 147/84 at highest with Coricidin HBP. Pt denies chest pain, shortness of breath, palpitations, fever, chills, or fatigue. She denies weakness, dizziness, headaches, or confusion.   Relevant past medical, surgical, family, and social history reviewed and updated as indicated.  Allergies and medications reviewed and updated.   Past Medical History:  Diagnosis Date  . Hyperlipidemia   . Hypertension   . Thyroid disease     Past Surgical History:  Procedure Laterality Date  . ABDOMINAL HYSTERECTOMY    . APPENDECTOMY    . tosillectomy      Social History   Socioeconomic History  . Marital status: Married    Spouse name: Not on file  . Number of children: Not on file  . Years of education: Not on file  . Highest education level: Not on file  Occupational History  . Not on file  Social Needs  . Financial resource strain: Not on file  . Food insecurity:    Worry: Not on file    Inability: Not on file  . Transportation needs:    Medical: Not  on file    Non-medical: Not on file  Tobacco Use  . Smoking status: Never Smoker  . Smokeless tobacco: Never Used  Substance and Sexual Activity  . Alcohol use: No  . Drug use: No  . Sexual activity: Not on file  Lifestyle  . Physical activity:    Days per week: Not on file    Minutes per session: Not on file  . Stress: Not on file  Relationships  . Social connections:    Talks on phone: Not on file    Gets together: Not on file    Attends religious service: Not on file    Active member of club or organization: Not on file    Attends meetings of clubs or organizations: Not on file    Relationship status: Not on file  . Intimate partner violence:    Fear of current or ex partner: Not on file    Emotionally abused: Not on file    Physically abused: Not on file    Forced sexual activity: Not on file  Other Topics Concern  . Not on file  Social History Narrative  . Not on file    Outpatient Encounter Medications as of 12/18/2018  Medication Sig  . amLODipine (NORVASC) 2.5 MG tablet Take 1 tablet (2.5 mg total) by mouth daily.  Marland Kitchen atenolol (TENORMIN) 50 MG tablet Take 1 tablet (50 mg total) by mouth daily.  . Calcium Carbonate-Vitamin D (OSCAL 500/200  D-3 PO) Take 1 capsule by mouth. Daily at lunchtime  . Cholecalciferol (VITAMIN D) 2000 units CAPS Take 1 capsule by mouth daily.  . Coenzyme Q10 (CO Q 10) 100 MG CAPS Take 1 capsule by mouth daily. 3 times per week  . eszopiclone (LUNESTA) 2 MG TABS tablet Take 1 tablet (2 mg total) by mouth at bedtime as needed. Take immediately before bedtime  . Glucosamine-Chondroitin (GLUCOSAMINE CHONDR COMPLEX PO) Take 600 mg by mouth daily.  Marland Kitchen levothyroxine (SYNTHROID, LEVOTHROID) 100 MCG tablet Take 1 tablet (100 mcg total) by mouth daily before breakfast.  . multivitamin-lutein (OCUVITE-LUTEIN) CAPS Take 1 capsule by mouth daily.  . Omega-3 Fatty Acids (FISH OIL) 600 MG CAPS Take 600 mg by mouth daily. 3 times per week  . Turmeric 500 MG  CAPS Take 1 capsule by mouth daily.  . vitamin E 400 UNIT capsule Take 400 Units by mouth. Three times a week  . benzonatate (TESSALON PERLES) 100 MG capsule Take 1 capsule (100 mg total) by mouth 3 (three) times daily as needed for cough.  . [DISCONTINUED] clobetasol (TEMOVATE) 0.05 % external solution Apply 1 application topically 2 (two) times daily.  . [DISCONTINUED] cyclobenzaprine (FLEXERIL) 10 MG tablet Take 1 tablet (10 mg total) by mouth 3 (three) times daily as needed for muscle spasms.  . [DISCONTINUED] scopolamine (TRANSDERM-SCOP, 1.5 MG,) 1 MG/3DAYS Place 1 patch (1.5 mg total) onto the skin every 3 (three) days.   No facility-administered encounter medications on file as of 12/18/2018.     Allergies  Allergen Reactions  . Penicillins   . Statins     Muscle problems   . Sulfonamide Derivatives     Review of Systems  Constitutional: Negative for chills, fatigue and fever.  HENT: Positive for congestion, postnasal drip, rhinorrhea and tinnitus. Negative for ear pain, sinus pressure, sinus pain, sore throat and trouble swallowing.   Respiratory: Positive for cough. Negative for chest tightness, shortness of breath and wheezing.   Cardiovascular: Negative for chest pain, palpitations and leg swelling.  Musculoskeletal: Negative for arthralgias and myalgias.  Neurological: Negative for dizziness, seizures, syncope, weakness, light-headedness, numbness and headaches.  Psychiatric/Behavioral: Negative for confusion.  All other systems reviewed and are negative.       Objective:    BP 132/84   Pulse 90   Temp 97.6 F (36.4 C) (Oral)   Ht '5\' 4"'$  (1.626 m)   Wt 144 lb (65.3 kg)   BMI 24.72 kg/m    Wt Readings from Last 3 Encounters:  12/18/18 144 lb (65.3 kg)  09/26/18 145 lb 9.6 oz (66 kg)  08/08/18 143 lb 12.8 oz (65.2 kg)    Physical Exam Vitals signs and nursing note reviewed.  Constitutional:      General: She is not in acute distress.    Appearance: Normal  appearance. She is well-developed, well-groomed and normal weight. She is not ill-appearing or toxic-appearing.  HENT:     Head: Normocephalic and atraumatic.     Jaw: There is normal jaw occlusion.     Right Ear: Hearing, ear canal and external ear normal. A middle ear effusion is present. Tympanic membrane is not perforated or erythematous.     Left Ear: Hearing, ear canal and external ear normal. A middle ear effusion is present. Tympanic membrane is not perforated or erythematous.     Nose: Congestion and rhinorrhea present. Rhinorrhea is clear.     Right Turbinates: Swollen.     Left Turbinates: Swollen.  Right Sinus: No maxillary sinus tenderness or frontal sinus tenderness.     Left Sinus: No maxillary sinus tenderness or frontal sinus tenderness.     Mouth/Throat:     Lips: Pink.     Mouth: Mucous membranes are moist.     Pharynx: Uvula midline. Posterior oropharyngeal erythema present. No pharyngeal swelling, oropharyngeal exudate or uvula swelling.     Tonsils: No tonsillar exudate or tonsillar abscesses.  Eyes:     General: Lids are normal.     Extraocular Movements: Extraocular movements intact.     Conjunctiva/sclera: Conjunctivae normal.     Pupils: Pupils are equal, round, and reactive to light.  Neck:     Musculoskeletal: Neck supple.     Thyroid: No thyroid mass, thyromegaly or thyroid tenderness.     Trachea: Trachea and phonation normal.  Cardiovascular:     Rate and Rhythm: Normal rate and regular rhythm.     Heart sounds: Normal heart sounds. No murmur. No friction rub. No gallop.   Pulmonary:     Effort: Pulmonary effort is normal. No respiratory distress.     Breath sounds: Normal breath sounds and air entry.  Lymphadenopathy:     Cervical: No cervical adenopathy.  Skin:    General: Skin is warm and dry.     Capillary Refill: Capillary refill takes less than 2 seconds.     Coloration: Skin is not cyanotic or pale.  Neurological:     General: No focal  deficit present.     Mental Status: She is alert and oriented to person, place, and time.     Cranial Nerves: Cranial nerves are intact.     Sensory: Sensation is intact.     Motor: Motor function is intact.  Psychiatric:        Mood and Affect: Mood normal.        Behavior: Behavior normal. Behavior is cooperative.        Thought Content: Thought content normal.        Judgment: Judgment normal.     Results for orders placed or performed in visit on 08/08/18  CBC with Differential/Platelet  Result Value Ref Range   WBC 6.4 3.4 - 10.8 x10E3/uL   RBC 5.15 3.77 - 5.28 x10E6/uL   Hemoglobin 15.5 11.1 - 15.9 g/dL   Hematocrit 46.4 34.0 - 46.6 %   MCV 90 79 - 97 fL   MCH 30.1 26.6 - 33.0 pg   MCHC 33.4 31.5 - 35.7 g/dL   RDW 12.7 12.3 - 15.4 %   Platelets 296 150 - 450 x10E3/uL   Neutrophils 60 Not Estab. %   Lymphs 27 Not Estab. %   Monocytes 8 Not Estab. %   Eos 4 Not Estab. %   Basos 1 Not Estab. %   Neutrophils Absolute 3.9 1.4 - 7.0 x10E3/uL   Lymphocytes Absolute 1.7 0.7 - 3.1 x10E3/uL   Monocytes Absolute 0.5 0.1 - 0.9 x10E3/uL   EOS (ABSOLUTE) 0.2 0.0 - 0.4 x10E3/uL   Basophils Absolute 0.1 0.0 - 0.2 x10E3/uL   Immature Granulocytes 0 Not Estab. %   Immature Grans (Abs) 0.0 0.0 - 0.1 x10E3/uL  CMP14+EGFR  Result Value Ref Range   Glucose 106 (H) 65 - 99 mg/dL   BUN 11 8 - 27 mg/dL   Creatinine, Ser 0.69 0.57 - 1.00 mg/dL   GFR calc non Af Amer 84 >59 mL/min/1.73   GFR calc Af Amer 96 >59 mL/min/1.73   BUN/Creatinine Ratio 16 12 -  28   Sodium 142 134 - 144 mmol/L   Potassium 4.4 3.5 - 5.2 mmol/L   Chloride 99 96 - 106 mmol/L   CO2 27 20 - 29 mmol/L   Calcium 9.1 8.7 - 10.3 mg/dL   Total Protein 6.7 6.0 - 8.5 g/dL   Albumin 4.2 3.5 - 4.8 g/dL   Globulin, Total 2.5 1.5 - 4.5 g/dL   Albumin/Globulin Ratio 1.7 1.2 - 2.2   Bilirubin Total 0.7 0.0 - 1.2 mg/dL   Alkaline Phosphatase 53 39 - 117 IU/L   AST 16 0 - 40 IU/L   ALT 15 0 - 32 IU/L  Lipid panel  Result  Value Ref Range   Cholesterol, Total 223 (H) 100 - 199 mg/dL   Triglycerides 142 0 - 149 mg/dL   HDL 43 >39 mg/dL   VLDL Cholesterol Cal 28 5 - 40 mg/dL   LDL Calculated 152 (H) 0 - 99 mg/dL   Chol/HDL Ratio 5.2 (H) 0.0 - 4.4 ratio  TSH  Result Value Ref Range   TSH 3.360 0.450 - 4.500 uIU/mL       Pertinent labs & imaging results that were available during my care of the patient were reviewed by me and considered in my medical decision making.  Assessment & Plan:  [name] was seen today for discuss amlodipine and uri.  Diagnoses and all orders for this visit:  URI with cough and congestion Increase fluid intake and humidity in the air. Coricidin HBP over the counter as needed. Flonase daily for at least 2 weeks. Report any new or worsening symptoms.  -     benzonatate (TESSALON PERLES) 100 MG capsule; Take 1 capsule (100 mg total) by mouth 3 (three) times daily as needed for cough.  Tinnitus of both ears Possibly from amlodipine. Has improved over the last week. May be due to bilateral serous otitis. Will trial Flonase for 2 weeks to see if helps. Follow up with PCP in 4 weeks or sooner if symptoms persist or worsen.   Non-recurrent acute serous otitis media of both ears Flonase daily for at least two weeks. Report any new or worsening symptoms.   Continue all other maintenance medications.  Follow up plan: Return in about 4 weeks (around 01/15/2019), or if symptoms worsen or fail to improve.  Educational handout given for URI  The above assessment and management plan was discussed with the patient. The patient verbalized understanding of and has agreed to the management plan. Patient is aware to call the clinic if symptoms persist or worsen. Patient is aware when to return to the clinic for a follow-up visit. Patient educated on when it is appropriate to go to the emergency department.   Monia Pouch, FNP-C [facility] [phone]

## 2019-01-15 DIAGNOSIS — H04123 Dry eye syndrome of bilateral lacrimal glands: Secondary | ICD-10-CM | POA: Diagnosis not present

## 2019-01-15 DIAGNOSIS — H2511 Age-related nuclear cataract, right eye: Secondary | ICD-10-CM | POA: Diagnosis not present

## 2019-01-15 DIAGNOSIS — H35372 Puckering of macula, left eye: Secondary | ICD-10-CM | POA: Diagnosis not present

## 2019-01-15 DIAGNOSIS — Z961 Presence of intraocular lens: Secondary | ICD-10-CM | POA: Diagnosis not present

## 2019-01-16 ENCOUNTER — Encounter: Payer: Self-pay | Admitting: Family Medicine

## 2019-01-16 ENCOUNTER — Ambulatory Visit (INDEPENDENT_AMBULATORY_CARE_PROVIDER_SITE_OTHER): Payer: Medicare HMO | Admitting: Family Medicine

## 2019-01-16 VITALS — BP 141/74 | HR 74 | Temp 97.7°F | Ht 64.0 in | Wt 143.0 lb

## 2019-01-16 DIAGNOSIS — I10 Essential (primary) hypertension: Secondary | ICD-10-CM

## 2019-01-16 DIAGNOSIS — H9313 Tinnitus, bilateral: Secondary | ICD-10-CM | POA: Diagnosis not present

## 2019-01-16 DIAGNOSIS — J309 Allergic rhinitis, unspecified: Secondary | ICD-10-CM

## 2019-01-16 MED ORDER — PREDNISONE 20 MG PO TABS: ORAL_TABLET | ORAL | 0 refills | Status: DC

## 2019-01-16 NOTE — Progress Notes (Signed)
BP (!) 141/74   Pulse 74   Temp 97.7 F (36.5 C) (Oral)   Ht 5\' 4"  (1.626 m)   Wt 143 lb (64.9 kg)   BMI 24.55 kg/m    Subjective:    Patient ID: [name], female    DOB: 1941/10/22, 79 y.o.   MRN: [id number]  HPI: [name] is a 78 y.o. female presenting on 01/16/2019 for Blood Pressure Check (4 week f/u); Cough; Chest Congestion; and Tinnitus   HPI Hypertension Patient is currently on amlodipine 2.5, and their blood pressure today is 141/74. Patient denies any lightheadedness or dizziness. Patient denies headaches, blurred vision, chest pains, shortness of breath, or weakness. Denies any side effects from medication and is content with current medication.   Sinus congestion, ringing in ears Comes in complaining of sinus congestion and ringing in ears is been going on for the past 4 weeks.  She says she did have some more chest congestion but that does seem to be improved but now she does has the sinus congestion and pressure and ringing in both of her ears.  She thinks it may be more in the right ear than the left.  She denies any fevers or chills.  Patient does have chronic allergies and sinus issues.  She did buy some Coricidin but has not taken it yet.  She did use the Flonase and [employer] given to her by 1 of my colleagues a couple weeks ago.  Relevant past medical, surgical, family and social history reviewed and updated as indicated. Interim medical history since our last visit reviewed. Allergies and medications reviewed and updated.  Review of Systems  Constitutional: Negative for chills and fever.  HENT: Positive for congestion, postnasal drip, rhinorrhea, sinus pressure, sneezing and sore throat. Negative for ear discharge and ear pain.   Eyes: Negative for visual disturbance.  Respiratory: Negative for chest tightness and shortness of breath.   Cardiovascular: Negative for chest pain and leg swelling.  Musculoskeletal: Negative for back pain and gait  problem.  Skin: Negative for rash.  Neurological: Negative for light-headedness and headaches.  Psychiatric/Behavioral: Negative for agitation and behavioral problems.  All other systems reviewed and are negative.   Per HPI unless specifically indicated above   Allergies as of 01/16/2019      Reactions   Penicillins    Statins    Muscle problems   Sulfonamide Derivatives       Medication List       Accurate as of January 16, 2019  8:26 AM. Always use your most recent med list.        amLODipine 2.5 MG tablet Commonly known as:  NORVASC Take 1 tablet (2.5 mg total) by mouth daily.   atenolol 50 MG tablet Commonly known as:  TENORMIN Take 1 tablet (50 mg total) by mouth daily.   benzonatate 100 MG capsule Commonly known as:  TESSALON PERLES Take 1 capsule (100 mg total) by mouth 3 (three) times daily as needed for cough.   Co Q 10 100 MG Caps Take 1 capsule by mouth daily. 3 times per week   eszopiclone 2 MG Tabs tablet Commonly known as:  LUNESTA Take 1 tablet (2 mg total) by mouth at bedtime as needed. Take immediately before bedtime   Fish Oil 600 MG Caps Take 600 mg by mouth daily. 3 times per week   GLUCOSAMINE CHONDR COMPLEX PO Take 600 mg by mouth daily.   levothyroxine 100 MCG tablet Commonly known as:  SYNTHROID, LEVOTHROID Take 1 tablet (100 mcg total) by mouth daily before breakfast.   multivitamin-lutein Caps capsule Take 1 capsule by mouth daily.   OSCAL 500/200 D-3 PO Take 1 capsule by mouth. Daily at lunchtime   predniSONE 20 MG tablet Commonly known as:  DELTASONE 2 po at same time daily for 5 days   Turmeric 500 MG Caps Take 1 capsule by mouth daily.   Vitamin D 50 MCG (2000 UT) Caps Take 1 capsule by mouth daily.   vitamin E 400 UNIT capsule Take 400 Units by mouth. Three times a week          Objective:    BP (!) 141/74   Pulse 74   Temp 97.7 F (36.5 C) (Oral)   Ht 5\' 4"  (1.626 m)   Wt 143 lb (64.9 kg)   BMI 24.55  kg/m   Wt Readings from Last 3 Encounters:  01/16/19 143 lb (64.9 kg)  12/18/18 144 lb (65.3 kg)  09/26/18 145 lb 9.6 oz (66 kg)    Physical Exam Vitals signs reviewed.  Constitutional:      General: She is not in acute distress.    Appearance: She is well-developed. She is not diaphoretic.  HENT:     Right Ear: Tympanic membrane, ear canal and external ear normal.     Left Ear: Tympanic membrane, ear canal and external ear normal.     Nose: Mucosal edema and rhinorrhea present.     Right Sinus: No maxillary sinus tenderness or frontal sinus tenderness.     Left Sinus: No maxillary sinus tenderness or frontal sinus tenderness.     Mouth/Throat:     Pharynx: Uvula midline. No oropharyngeal exudate or posterior oropharyngeal erythema.     Tonsils: No tonsillar abscesses.  Eyes:     Conjunctiva/sclera: Conjunctivae normal.  Cardiovascular:     Rate and Rhythm: Normal rate and regular rhythm.     Heart sounds: Normal heart sounds. No murmur.  Pulmonary:     Effort: Pulmonary effort is normal. No respiratory distress.     Breath sounds: Normal breath sounds. No wheezing.  Musculoskeletal: Normal range of motion.        General: No tenderness.  Skin:    General: Skin is warm and dry.     Findings: No rash.  Neurological:     Mental Status: She is alert and oriented to person, place, and time.     Coordination: Coordination normal.  Psychiatric:        Behavior: Behavior normal.         Assessment & Plan:   Problem List Items Addressed This Visit      Cardiovascular and Mediastinum   Essential hypertension - Primary    Other Visit Diagnoses    Allergic sinusitis       Relevant Medications   predniSONE (DELTASONE) 20 MG tablet   Tinnitus of both ears          Do not think the tinnitus or sinus congestion is related to the amlodipine, more think it is related to her chronic sinuses and allergies that she has going on. Follow up plan: Return in about 6 months (around  07/17/2019), or if symptoms worsen or fail to improve, for Hypertension.  Counseling provided for all of the vaccine components No orders of the defined types were placed in this encounter.   Caryl Pina, MD [facility] 01/16/2019, 8:26 AM

## 2019-05-14 DIAGNOSIS — R69 Illness, unspecified: Secondary | ICD-10-CM | POA: Diagnosis not present

## 2019-07-24 ENCOUNTER — Ambulatory Visit: Payer: Medicare HMO | Admitting: Family Medicine

## 2019-08-09 ENCOUNTER — Other Ambulatory Visit: Payer: Self-pay

## 2019-08-09 ENCOUNTER — Other Ambulatory Visit: Payer: Self-pay | Admitting: Family Medicine

## 2019-08-09 DIAGNOSIS — Z1231 Encounter for screening mammogram for malignant neoplasm of breast: Secondary | ICD-10-CM

## 2019-08-12 ENCOUNTER — Ambulatory Visit (INDEPENDENT_AMBULATORY_CARE_PROVIDER_SITE_OTHER): Payer: Medicare HMO | Admitting: Family Medicine

## 2019-08-12 ENCOUNTER — Encounter: Payer: Self-pay | Admitting: Family Medicine

## 2019-08-12 ENCOUNTER — Other Ambulatory Visit: Payer: Self-pay

## 2019-08-12 VITALS — BP 150/77 | HR 77 | Temp 96.8°F | Ht 64.0 in | Wt 146.6 lb

## 2019-08-12 DIAGNOSIS — E782 Mixed hyperlipidemia: Secondary | ICD-10-CM | POA: Diagnosis not present

## 2019-08-12 DIAGNOSIS — E039 Hypothyroidism, unspecified: Secondary | ICD-10-CM | POA: Diagnosis not present

## 2019-08-12 DIAGNOSIS — G4700 Insomnia, unspecified: Secondary | ICD-10-CM

## 2019-08-12 DIAGNOSIS — Z0001 Encounter for general adult medical examination with abnormal findings: Secondary | ICD-10-CM

## 2019-08-12 DIAGNOSIS — I10 Essential (primary) hypertension: Secondary | ICD-10-CM | POA: Diagnosis not present

## 2019-08-12 DIAGNOSIS — Z0000 Encounter for general adult medical examination without abnormal findings: Secondary | ICD-10-CM

## 2019-08-12 MED ORDER — LISINOPRIL 5 MG PO TABS: 5.0000 mg | ORAL_TABLET | Freq: Every day | ORAL | 3 refills | Status: DC

## 2019-08-12 MED ORDER — ESZOPICLONE 2 MG PO TABS: 2.0000 mg | ORAL_TABLET | Freq: Every evening | ORAL | 2 refills | Status: DC | PRN

## 2019-08-12 MED ORDER — ATENOLOL 50 MG PO TABS: 50.0000 mg | ORAL_TABLET | Freq: Every day | ORAL | 3 refills | Status: DC

## 2019-08-12 NOTE — Progress Notes (Signed)
BP (!) 150/77   Pulse 77   Temp (!) 96.8 F (36 C) (Temporal)   Ht 5' 4"  (1.626 m)   Wt 146 lb 9.6 oz (66.5 kg)   BMI 25.16 kg/m    Subjective:   Patient ID: [name], female    DOB: [birth year], 78 y.o.   MRN: [id number]  HPI: [name] is a 78 y.o. female presenting on 08/12/2019 for Annual Exam   HPI Adult well exam and physical and checkup of chronic issues. Patient is coming in today for adult well exam and physical and checkup of chronic issues.  She has some blood pressure records with her and they look like they are running all within the 120s 130s  Hypertension Patient is currently on amlodipine and atenolol, and their blood pressure today is 150/77.  Patient says she does have a lot of swelling in her legs and sometimes she gets ringing in her ears and she thinks is because the amlodipine because it all started since she started the amlodipine and would like to discuss other options. Patient denies any lightheadedness or dizziness. Patient denies headaches, blurred vision, chest pains, shortness of breath, or weakness. Denies any side effects from medication and is content with current medication.   Hyperlipidemia Patient is coming in for recheck of his hyperlipidemia. The patient is currently taking fish oils. They deny any issues with myalgias or history of liver damage from it. They deny any focal numbness or weakness or chest pain.   Hypothyroidism recheck Patient is coming in for thyroid recheck today as well. They deny any issues with hair changes or heat or cold problems or diarrhea or constipation. They deny any chest pain or palpitations. They are currently on levothyroxine 17mcrograms   Relevant past medical, surgical, family and social history reviewed and updated as indicated. Interim medical history since our last visit reviewed. Allergies and medications reviewed and updated.  Review of Systems  Constitutional: Negative for chills and fever.   Eyes: Negative for visual disturbance.  Respiratory: Negative for chest tightness and shortness of breath.   Cardiovascular: Negative for chest pain and leg swelling.  Musculoskeletal: Negative for back pain and gait problem.  Skin: Negative for rash.  Neurological: Negative for dizziness, weakness, light-headedness, numbness and headaches.  Psychiatric/Behavioral: Negative for agitation, behavioral problems, self-injury, sleep disturbance and suicidal ideas. The patient is not nervous/anxious.   All other systems reviewed and are negative.   Per HPI unless specifically indicated above   Allergies as of 08/12/2019      Reactions   Penicillins    Statins    Muscle problems   Sulfonamide Derivatives       Medication List       Accurate as of August 12, 2019  2:23 PM. If you have any questions, ask your nurse or doctor.        STOP taking these medications   benzonatate 100 MG capsule Commonly known as: TBest boyStopped by: JWorthy Rancher MD   predniSONE 20 MG tablet Commonly known as: DELTASONE Stopped by: JFransisca KaufmannDettinger, MD   vitamin E 400 UNIT capsule Stopped by: JFransisca KaufmannDettinger, MD     TAKE these medications   amLODipine 2.5 MG tablet Commonly known as: NORVASC Take 1 tablet (2.5 mg total) by mouth daily.   atenolol 50 MG tablet Commonly known as: TENORMIN Take 1 tablet (50 mg total) by mouth daily.   Co Q 10 100 MG Caps Take 1 capsule  by mouth daily. 3 times per week   eszopiclone 2 MG Tabs tablet Commonly known as: LUNESTA Take 1 tablet (2 mg total) by mouth at bedtime as needed. Take immediately before bedtime   Fish Oil 600 MG Caps Take 600 mg by mouth daily. 3 times per week   GLUCOSAMINE CHONDR COMPLEX PO Take 600 mg by mouth daily.   levothyroxine 100 MCG tablet Commonly known as: SYNTHROID Take 1 tablet (100 mcg total) by mouth daily before breakfast.   multivitamin-lutein Caps capsule Take 1 capsule by mouth daily.    OSCAL 500/200 D-3 PO Take 1 capsule by mouth. Daily at lunchtime   Turmeric 500 MG Caps Take 1 capsule by mouth daily.   Vitamin D 50 MCG (2000 UT) Caps Take 1 capsule by mouth daily.        Objective:   BP (!) 150/77   Pulse 77   Temp (!) 96.8 F (36 C) (Temporal)   Ht 5' 4"  (1.626 m)   Wt 146 lb 9.6 oz (66.5 kg)   BMI 25.16 kg/m   Wt Readings from Last 3 Encounters:  08/12/19 146 lb 9.6 oz (66.5 kg)  01/16/19 143 lb (64.9 kg)  12/18/18 144 lb (65.3 kg)    Physical Exam Vitals signs and nursing note reviewed.  Constitutional:      General: She is not in acute distress.    Appearance: She is well-developed. She is not diaphoretic.  HENT:     Right Ear: Tympanic membrane normal.     Left Ear: Tympanic membrane normal.     Mouth/Throat:     Mouth: Mucous membranes are moist.     Pharynx: Oropharynx is clear.  Eyes:     Extraocular Movements: Extraocular movements intact.     Conjunctiva/sclera: Conjunctivae normal.     Pupils: Pupils are equal, round, and reactive to light.  Neck:     Musculoskeletal: Normal range of motion and neck supple.  Cardiovascular:     Rate and Rhythm: Normal rate and regular rhythm.     Heart sounds: Normal heart sounds. No murmur.  Pulmonary:     Effort: Pulmonary effort is normal. No respiratory distress.     Breath sounds: Normal breath sounds. No wheezing.  Abdominal:     General: Abdomen is flat. Bowel sounds are normal.     Palpations: Abdomen is soft.     Tenderness: There is no right CVA tenderness or left CVA tenderness.  Musculoskeletal: Normal range of motion.        General: No tenderness.  Skin:    General: Skin is warm and dry.     Findings: No rash.  Neurological:     Mental Status: She is alert and oriented to person, place, and time.     Coordination: Coordination normal.  Psychiatric:        Behavior: Behavior normal.       Assessment & Plan:   Problem List Items Addressed This Visit       Cardiovascular and Mediastinum   Essential hypertension   Relevant Medications   atenolol (TENORMIN) 50 MG tablet   lisinopril (ZESTRIL) 5 MG tablet   Other Relevant Orders   CBC with Differential/Platelet   CMP14+EGFR     Endocrine   Hypothyroidism   Relevant Medications   atenolol (TENORMIN) 50 MG tablet   lisinopril (ZESTRIL) 5 MG tablet   Other Relevant Orders   CBC with Differential/Platelet     Other   Hyperlipidemia  Relevant Medications   atenolol (TENORMIN) 50 MG tablet   lisinopril (ZESTRIL) 5 MG tablet   Other Relevant Orders   TSH    Other Visit Diagnoses    Annual physical exam    -  Primary   Relevant Medications   lisinopril (ZESTRIL) 5 MG tablet   Other Relevant Orders   CBC with Differential/Platelet   CMP14+EGFR   Lipid panel   TSH   Insomnia, unspecified type       Relevant Medications   eszopiclone (LUNESTA) 2 MG TABS tablet      Recommended for patient to switch from amlodipine to lisinopril because of swelling.  Continue to monitor home blood pressures and will do blood work in 2 weeks after starting lisinopril. Follow up plan: Return in about 6 months (around 02/09/2020), or if symptoms worsen or fail to improve, for Hypertension and cholesterol recheck.  Counseling provided for all of the vaccine components No orders of the defined types were placed in this encounter.   Caryl Pina, MD [facility] 08/12/2019, 2:23 PM

## 2019-08-27 ENCOUNTER — Other Ambulatory Visit: Payer: Self-pay

## 2019-08-27 ENCOUNTER — Other Ambulatory Visit: Payer: Medicare HMO

## 2019-08-27 DIAGNOSIS — I10 Essential (primary) hypertension: Secondary | ICD-10-CM | POA: Diagnosis not present

## 2019-08-27 DIAGNOSIS — E782 Mixed hyperlipidemia: Secondary | ICD-10-CM

## 2019-08-27 DIAGNOSIS — Z0000 Encounter for general adult medical examination without abnormal findings: Secondary | ICD-10-CM

## 2019-08-27 DIAGNOSIS — E039 Hypothyroidism, unspecified: Secondary | ICD-10-CM

## 2019-08-28 LAB — CBC WITH DIFFERENTIAL/PLATELET
Basophils Absolute: 0.1 10*3/uL (ref 0.0–0.2)
Basos: 1 %
EOS (ABSOLUTE): 0.2 10*3/uL (ref 0.0–0.4)
Eos: 4 %
Hematocrit: 46.5 % (ref 34.0–46.6)
Hemoglobin: 15.3 g/dL (ref 11.1–15.9)
Immature Grans (Abs): 0 10*3/uL (ref 0.0–0.1)
Immature Granulocytes: 0 %
Lymphocytes Absolute: 1.7 10*3/uL (ref 0.7–3.1)
Lymphs: 26 %
MCH: 29.8 pg (ref 26.6–33.0)
MCHC: 32.9 g/dL (ref 31.5–35.7)
MCV: 91 fL (ref 79–97)
Monocytes Absolute: 0.6 10*3/uL (ref 0.1–0.9)
Monocytes: 9 %
Neutrophils Absolute: 3.9 10*3/uL (ref 1.4–7.0)
Neutrophils: 60 %
Platelets: 292 10*3/uL (ref 150–450)
RBC: 5.14 x10E6/uL (ref 3.77–5.28)
RDW: 12.3 % (ref 11.7–15.4)
WBC: 6.5 10*3/uL (ref 3.4–10.8)

## 2019-08-28 LAB — CMP14+EGFR
ALT: 14 IU/L (ref 0–32)
AST: 15 IU/L (ref 0–40)
Albumin/Globulin Ratio: 1.8 (ref 1.2–2.2)
Albumin: 4.1 g/dL (ref 3.7–4.7)
Alkaline Phosphatase: 50 IU/L (ref 39–117)
BUN/Creatinine Ratio: 16 (ref 12–28)
BUN: 13 mg/dL (ref 8–27)
Bilirubin Total: 0.7 mg/dL (ref 0.0–1.2)
CO2: 23 mmol/L (ref 20–29)
Calcium: 9.2 mg/dL (ref 8.7–10.3)
Chloride: 103 mmol/L (ref 96–106)
Creatinine, Ser: 0.83 mg/dL (ref 0.57–1.00)
GFR calc Af Amer: 78 mL/min/{1.73_m2} (ref >59)
GFR calc non Af Amer: 67 mL/min/{1.73_m2} (ref >59)
Globulin, Total: 2.3 g/dL (ref 1.5–4.5)
Glucose: 111 mg/dL — ABNORMAL HIGH (ref 65–99)
Potassium: 4.4 mmol/L (ref 3.5–5.2)
Sodium: 141 mmol/L (ref 134–144)
Total Protein: 6.4 g/dL (ref 6.0–8.5)

## 2019-08-28 LAB — LIPID PANEL
Chol/HDL Ratio: 4.3 ratio (ref 0.0–4.4)
Cholesterol, Total: 198 mg/dL (ref 100–199)
HDL: 46 mg/dL (ref >39)
LDL Chol Calc (NIH): 123 mg/dL — ABNORMAL HIGH (ref 0–99)
Triglycerides: 162 mg/dL — ABNORMAL HIGH (ref 0–149)
VLDL Cholesterol Cal: 29 mg/dL (ref 5–40)

## 2019-08-28 LAB — TSH: TSH: 2.62 u[IU]/mL (ref 0.450–4.500)

## 2019-08-30 ENCOUNTER — Ambulatory Visit (INDEPENDENT_AMBULATORY_CARE_PROVIDER_SITE_OTHER): Payer: Medicare HMO | Admitting: *Deleted

## 2019-08-30 VITALS — BP 115/72

## 2019-08-30 DIAGNOSIS — Z0000 Encounter for general adult medical examination without abnormal findings: Secondary | ICD-10-CM

## 2019-08-30 NOTE — Progress Notes (Addendum)
MEDICARE ANNUAL WELLNESS VISIT  08/30/2019  Telephone Visit Disclaimer This Medicare AWV was conducted by telephone due to national recommendations for restrictions regarding the COVID-19 Pandemic (e.g. social distancing).  I verified, using two identifiers, that I am speaking with [name] or their authorized healthcare agent. I discussed the limitations, risks, security, and privacy concerns of performing an evaluation and management service by telephone and the potential availability of an in-person appointment in the future. The patient expressed understanding and agreed to proceed.   Subjective:  [name] is a 78 y.o. female patient of Dettinger, Fransisca Kaufmann, MD who had a Medicare Annual Wellness Visit today via telephone. [name] is retired from [employer] and lives with their spouse. she has 3 children. she reports that she is socially active and does interact with friends/family regularly. she is moderately physically active and enjoys exercising, playing piano, book clubs and knitting.  Patient Care Team: Dettinger, Fransisca Kaufmann, MD as PCP - General (Family Medicine)  Advanced Directives 08/30/2019  Does Patient Have a Medical Advance Directive? No  Would patient like information on creating a medical advance directive? No - Patient declined    Hospital Utilization Over the Past 12 Months: # of hospitalizations or ER visits: 0 # of surgeries: 0  Review of Systems    Patient reports that her overall health is unchanged compared to last year.    Patient Reported Readings (BP, Pulse, CBG, Weight, etc) 117/72 taken today at home  Pain Assessment Pain : No/denies pain     Current Medications & Allergies (verified) Allergies as of 08/30/2019      Reactions   Penicillins    Statins    Muscle problems   Sulfonamide Derivatives       Medication List       Accurate as of August 30, 2019  1:56 PM. If you have any questions, ask your nurse or doctor.        STOP  taking these medications   amLODipine 2.5 MG tablet Commonly known as: NORVASC   Co Q 10 100 MG Caps     TAKE these medications   atenolol 50 MG tablet Commonly known as: TENORMIN Take 1 tablet (50 mg total) by mouth daily.   eszopiclone 2 MG Tabs tablet Commonly known as: LUNESTA Take 1 tablet (2 mg total) by mouth at bedtime as needed. Take immediately before bedtime   Fish Oil 600 MG Caps Take 1,200 mg by mouth 2 (two) times daily. 3 times per week   GLUCOSAMINE CHONDR COMPLEX PO Take 600 mg by mouth daily.   levothyroxine 100 MCG tablet Commonly known as: SYNTHROID Take 1 tablet (100 mcg total) by mouth daily before breakfast.   lisinopril 5 MG tablet Commonly known as: ZESTRIL Take 1 tablet (5 mg total) by mouth daily.   multivitamin-lutein Caps capsule Take 1 capsule by mouth daily.   OSCAL 500/200 D-3 PO Take 1 capsule by mouth. Daily at lunchtime   Turmeric 500 MG Caps Take 1 capsule by mouth daily.   Vitamin D 50 MCG (2000 UT) Caps Take 1 capsule by mouth daily.       History (reviewed): Past Medical History:  Diagnosis Date  . Hyperlipidemia   . Hypertension   . Thyroid disease    Past Surgical History:  Procedure Laterality Date  . ABDOMINAL HYSTERECTOMY    . APPENDECTOMY    . tosillectomy     Family History  Problem Relation Age of Onset  .  Cancer Mother        breast  . Heart disease Mother   . Parkinson's disease Father   . Breast cancer Neg Hx    Social History   Socioeconomic History  . Marital status: Married    Spouse name: Alvester Chou  . Number of children: 3  . Years of education: Not on file  . Highest education level: Bachelor's degree (e.g., BA, AB, BS)  Occupational History  . Not on file  Social Needs  . Financial resource strain: Not hard at all  . Food insecurity    Worry: Never true    Inability: Never true  . Transportation needs    Medical: No    Non-medical: No  Tobacco Use  . Smoking status: Never Smoker   . Smokeless tobacco: Never Used  Substance and Sexual Activity  . Alcohol use: Yes    Alcohol/week: 1.0 standard drinks    Types: 1 Glasses of wine per week    Comment: occ  . Drug use: No  . Sexual activity: Not Currently  Lifestyle  . Physical activity    Days per week: 7 days    Minutes per session: 40 min  . Stress: Not at all  Relationships  . Social connections    Talks on phone: More than three times a week    Gets together: Three times a week    Attends religious service: More than 4 times per year    Active member of club or organization: Yes    Attends meetings of clubs or organizations: More than 4 times per year    Relationship status: Married  Other Topics Concern  . Not on file  Social History Narrative  . Not on file    Activities of Daily Living In your present state of health, do you have any difficulty performing the following activities: 08/30/2019  Hearing? N  Vision? N  Difficulty concentrating or making decisions? N  Walking or climbing stairs? N  Dressing or bathing? N  Doing errands, shopping? N  Preparing Food and eating ? N  Using the Toilet? N  In the past six months, have you accidently leaked urine? N  Do you have problems with loss of bowel control? N  Managing your Medications? N  Managing your Finances? N  Housekeeping or managing your Housekeeping? N  Some recent data might be hidden    Patient Education/ Literacy How often do you need to have someone help you when you read instructions, pamphlets, or other written materials from your doctor or pharmacy?: 1 - Never What is the last grade level you completed in school?: 12  Exercise Current Exercise Habits: Home exercise routine, Type of exercise: stretching;walking;Other - see comments(zumba), Time (Minutes): 40, Frequency (Times/Week): 7, Weekly Exercise (Minutes/Week): 280  Diet Patient reports consuming 3 meals a day and 0 snack(s) a day Patient reports that her primary diet  is: Low fat Patient reports that she does have regular access to food.   Depression Screen PHQ 2/9 Scores 08/30/2019 08/12/2019 01/16/2019 12/18/2018 09/26/2018 08/08/2018 02/26/2018  PHQ - 2 Score 0 0 0 0 0 0 0     Fall Risk Fall Risk  08/30/2019 08/12/2019 01/16/2019 12/18/2018 09/26/2018  Falls in the past year? 0 0 0 0 No  Number falls in past yr: - - - - -  Comment - - - - -     Objective:  [name] seemed alert and oriented and she participated appropriately during our telephone  visit.  Blood Pressure Weight BMI  BP Readings from Last 3 Encounters:  08/30/19 115/72  08/12/19 (!) 150/77  01/16/19 (!) 141/74   Wt Readings from Last 3 Encounters:  08/12/19 146 lb 9.6 oz (66.5 kg)  01/16/19 143 lb (64.9 kg)  12/18/18 144 lb (65.3 kg)   BMI Readings from Last 1 Encounters:  08/12/19 25.16 kg/m    *Unable to obtain current vital signs, weight, and BMI due to telephone visit type  Hearing/Vision  . [name] did not seem to have difficulty with hearing/understanding during the telephone conversation . Reports that she has had a formal eye exam by an eye care professional within the past year . Reports that she has not had a formal hearing evaluation within the past year *Unable to fully assess hearing and vision during telephone visit type  Cognitive Function: 6CIT Screen 08/30/2019  What Year? 0 points  What month? 0 points  What time? 0 points  Count back from 20 0 points  Months in reverse 0 points  Repeat phrase 0 points  Total Score 0   (Normal:0-7, Significant for Dysfunction: >8)  Normal Cognitive Function Screening: Yes   Immunization & Health Maintenance Record Immunization History  Administered Date(s) Administered  . Pneumococcal Conjugate-13 06/18/2015  . Pneumococcal-Unspecified 05/12/2009  . Td 05/18/2016  . Tetanus 01/12/2005  . Zoster 05/25/2010    Health Maintenance  Topic Date Due  . INFLUENZA VACCINE  07/13/2019  . MAMMOGRAM  09/14/2019  .  TETANUS/TDAP  05/18/2026  . DEXA SCAN  Completed  . PNA vac Low Risk Adult  Completed       Assessment  This is a routine wellness examination for [name].  Health Maintenance: Due or Overdue Health Maintenance Due  Topic Date Due  . INFLUENZA VACCINE  07/13/2019    [name] does not need a referral for Community Assistance: Care Management:   no Social Work:    no Prescription Assistance:  no Nutrition/Diabetes Education:  no   Plan:  Personalized Goals Goals Addressed   None    Personalized Health Maintenance & Screening Recommendations  Influenza vaccine  Lung Cancer Screening Recommended: no (Low Dose CT Chest recommended if Age 60-80 years, 30 pack-year currently smoking OR have quit w/in past 15 years) Hepatitis C Screening recommended: no HIV Screening recommended: no  Advanced Directives: Written information was not prepared per patient's request.  Referrals & Orders No orders of the defined types were placed in this encounter.   Follow-up Plan . Follow-up with Dettinger, Fransisca Kaufmann, MD as planned . Schedule Flu shot- pt will wait till October 2020 to receive this. . Pt is going for her mammogram at [facility] in October.    I have personally reviewed and noted the following in the patient's chart:   . Medical and social history . Use of alcohol, tobacco or illicit drugs  . Current medications and supplements . Functional ability and status . Nutritional status . Physical activity . Advanced directives . List of other physicians . Hospitalizations, surgeries, and ER visits in previous 12 months . Vitals . Screenings to include cognitive, depression, and falls . Referrals and appointments  In addition, I have reviewed and discussed with [name] certain preventive protocols, quality metrics, and best practice recommendations. A written personalized care plan for preventive services as well as general preventive health  recommendations is available and can be mailed to the patient at her request.      Andrik Sandt, [place] Bruns,  LPN  [id number]

## 2019-09-04 DIAGNOSIS — R110 Nausea: Secondary | ICD-10-CM | POA: Diagnosis not present

## 2019-09-04 DIAGNOSIS — I10 Essential (primary) hypertension: Secondary | ICD-10-CM | POA: Diagnosis not present

## 2019-09-05 ENCOUNTER — Telehealth: Payer: Self-pay | Admitting: Family Medicine

## 2019-09-05 ENCOUNTER — Ambulatory Visit (INDEPENDENT_AMBULATORY_CARE_PROVIDER_SITE_OTHER): Payer: Medicare HMO | Admitting: Family Medicine

## 2019-09-05 ENCOUNTER — Encounter: Payer: Self-pay | Admitting: Family Medicine

## 2019-09-05 ENCOUNTER — Other Ambulatory Visit: Payer: Self-pay

## 2019-09-05 DIAGNOSIS — I10 Essential (primary) hypertension: Secondary | ICD-10-CM

## 2019-09-05 MED ORDER — LISINOPRIL 20 MG PO TABS: 20.0000 mg | ORAL_TABLET | Freq: Every day | ORAL | 1 refills | Status: DC

## 2019-09-05 NOTE — Progress Notes (Signed)
Virtual Visit via telephone Note  I connected with [name] on 09/05/19 at 1148 by telephone and verified that I am speaking with the correct person using two identifiers. [name] is currently located at home and no other people are currently with her during visit. The provider, Fransisca Kaufmann , MD is located in their office at time of visit.  Call ended at 1158  I discussed the limitations, risks, security and privacy concerns of performing an evaluation and management service by telephone and the availability of in person appointments. I also discussed with the patient that there may be a patient responsible charge related to this service. The patient expressed understanding and agreed to proceed.   History and Present Illness: Hypertension Patient is currently on lisinopril and atenolol, and their blood pressure today is 142/86, 166/94, and today 172/101, 166/89. Patient denies any lightheadedness or dizziness. Patient denies headaches, blurred vision, chest pains, shortness of breath, or weakness. Denies any side effects from medication and is content with current medication.   No diagnosis found.  Outpatient Encounter Medications as of 09/05/2019  Medication Sig  . atenolol (TENORMIN) 50 MG tablet Take 1 tablet (50 mg total) by mouth daily.  . Calcium Carbonate-Vitamin D (OSCAL 500/200 D-3 PO) Take 1 capsule by mouth. Daily at lunchtime  . Cholecalciferol (VITAMIN D) 2000 units CAPS Take 1 capsule by mouth daily.  . eszopiclone (LUNESTA) 2 MG TABS tablet Take 1 tablet (2 mg total) by mouth at bedtime as needed. Take immediately before bedtime (Patient not taking: Reported on 08/30/2019)  . Glucosamine-Chondroitin (GLUCOSAMINE CHONDR COMPLEX PO) Take 600 mg by mouth daily.  Marland Kitchen levothyroxine (SYNTHROID, LEVOTHROID) 100 MCG tablet Take 1 tablet (100 mcg total) by mouth daily before breakfast.  . lisinopril (ZESTRIL) 5 MG tablet Take 1 tablet (5 mg total) by mouth daily.  .  multivitamin-lutein (OCUVITE-LUTEIN) CAPS Take 1 capsule by mouth daily.  . Omega-3 Fatty Acids (FISH OIL) 600 MG CAPS Take 1,200 mg by mouth 2 (two) times daily. 3 times per week  . Turmeric 500 MG CAPS Take 1 capsule by mouth daily.   No facility-administered encounter medications on file as of 09/05/2019.     Review of Systems  Constitutional: Negative for chills and fever.  Eyes: Negative for visual disturbance.  Respiratory: Negative for chest tightness and shortness of breath.   Cardiovascular: Negative for chest pain and leg swelling.  Musculoskeletal: Negative for back pain and gait problem.  Skin: Negative for rash.  Neurological: Positive for headaches. Negative for dizziness and light-headedness.  Psychiatric/Behavioral: Negative for agitation and behavioral problems.  All other systems reviewed and are negative.   Observations/Objective: Patient sounds comfortable and in no acute distress  Assessment and Plan: Problem List Items Addressed This Visit      Cardiovascular and Mediastinum   Essential hypertension - Primary   Relevant Medications   lisinopril (ZESTRIL) 20 MG tablet     bp elevated at home and will increase to 20 mg  Follow Up Instructions: Follow up in  3-4 weeks for htn recheck    I discussed the assessment and treatment plan with the patient. The patient was provided an opportunity to ask questions and all were answered. The patient agreed with the plan and demonstrated an understanding of the instructions.   The patient was advised to call back or seek an in-person evaluation if the symptoms worsen or if the condition fails to improve as anticipated.  The above assessment and management plan  was discussed with the patient. The patient verbalized understanding of and has agreed to the management plan. Patient is aware to call the clinic if symptoms persist or worsen. Patient is aware when to return to the clinic for a follow-up visit. Patient educated  on when it is appropriate to go to the emergency department.    I provided 10 minutes of non-face-to-face time during this encounter.    Worthy Rancher, MD

## 2019-09-09 ENCOUNTER — Telehealth: Payer: Self-pay | Admitting: Family Medicine

## 2019-09-09 NOTE — Telephone Encounter (Signed)
Patient aware and verbalizes understanding. 

## 2019-09-09 NOTE — Telephone Encounter (Signed)
Have her give it some more time and let the lisinopril get into her system at the higher dose, continue to monitor the blood pressures and send them to [place].

## 2019-09-23 ENCOUNTER — Other Ambulatory Visit: Payer: Self-pay | Admitting: Family Medicine

## 2019-09-23 DIAGNOSIS — E039 Hypothyroidism, unspecified: Secondary | ICD-10-CM

## 2019-09-24 ENCOUNTER — Ambulatory Visit (INDEPENDENT_AMBULATORY_CARE_PROVIDER_SITE_OTHER): Payer: Medicare HMO | Admitting: Family Medicine

## 2019-09-24 ENCOUNTER — Encounter: Payer: Self-pay | Admitting: Family Medicine

## 2019-09-24 DIAGNOSIS — I10 Essential (primary) hypertension: Secondary | ICD-10-CM

## 2019-09-24 MED ORDER — ATENOLOL 50 MG PO TABS: 100.0000 mg | ORAL_TABLET | Freq: Every day | ORAL | 3 refills | Status: DC

## 2019-09-24 NOTE — Progress Notes (Signed)
Virtual Visit via telephone Note  I connected with [name] on 09/24/19 at 1005 by telephone and verified that I am speaking with the correct person using two identifiers. [name] is currently located at home and no other people are currently with her during visit. The provider, Fransisca Kaufmann Dettinger, MD is located in their office at time of visit.  Call ended at 1018  I discussed the limitations, risks, security and privacy concerns of performing an evaluation and management service by telephone and the availability of in person appointments. I also discussed with the patient that there may be a patient responsible charge related to this service. The patient expressed understanding and agreed to proceed.  bp 140/79, 139/72, 145/75, 137/80, hr 78 History and Present Illness: Hypertension Patient is currently on atenolol and lisinopril, and their blood pressure today is 145/75. Patient denies any lightheadedness or dizziness. Patient denies headaches, blurred vision, chest pains, shortness of breath, or weakness. Denies any side effects from medication and is content with current medication. She does not feel good when it is up a little  No diagnosis found.  Outpatient Encounter Medications as of 09/24/2019  Medication Sig  . atenolol (TENORMIN) 50 MG tablet Take 1 tablet (50 mg total) by mouth daily.  . Calcium Carbonate-Vitamin D (OSCAL 500/200 D-3 PO) Take 1 capsule by mouth. Daily at lunchtime  . Cholecalciferol (VITAMIN D) 2000 units CAPS Take 1 capsule by mouth daily.  . eszopiclone (LUNESTA) 2 MG TABS tablet Take 1 tablet (2 mg total) by mouth at bedtime as needed. Take immediately before bedtime (Patient not taking: Reported on 08/30/2019)  . EUTHYROX 100 MCG tablet TAKE 1 TABLET BY MOUTH ONCE DAILY BEFORE BREAKFAST  . Glucosamine-Chondroitin (GLUCOSAMINE CHONDR COMPLEX PO) Take 600 mg by mouth daily.  Marland Kitchen lisinopril (ZESTRIL) 20 MG tablet Take 1 tablet (20 mg total) by mouth  daily.  . multivitamin-lutein (OCUVITE-LUTEIN) CAPS Take 1 capsule by mouth daily.  . Omega-3 Fatty Acids (FISH OIL) 600 MG CAPS Take 1,200 mg by mouth 2 (two) times daily. 3 times per week  . Turmeric 500 MG CAPS Take 1 capsule by mouth daily.   No facility-administered encounter medications on file as of 09/24/2019.     Review of Systems  Constitutional: Positive for fatigue. Negative for chills and fever.  Eyes: Negative for visual disturbance.  Respiratory: Negative for chest tightness and shortness of breath.   Cardiovascular: Negative for chest pain and leg swelling.  Musculoskeletal: Negative for back pain and gait problem.  Skin: Negative for rash.  Neurological: Negative for dizziness, weakness, light-headedness and headaches.  Psychiatric/Behavioral: Negative for agitation and behavioral problems.  All other systems reviewed and are negative.   Observations/Objective: Patient sounds comfortable  Assessment and Plan: Problem List Items Addressed This Visit      Cardiovascular and Mediastinum   Essential hypertension - Primary   Relevant Medications   atenolol (TENORMIN) 50 MG tablet       Follow Up Instructions: Increase atenolol to 100 mg and recheck in 4 months for htn    I discussed the assessment and treatment plan with the patient. The patient was provided an opportunity to ask questions and all were answered. The patient agreed with the plan and demonstrated an understanding of the instructions.   The patient was advised to call back or seek an in-person evaluation if the symptoms worsen or if the condition fails to improve as anticipated.  The above assessment and management plan was discussed  with the patient. The patient verbalized understanding of and has agreed to the management plan. Patient is aware to call the clinic if symptoms persist or worsen. Patient is aware when to return to the clinic for a follow-up visit. Patient educated on when it is  appropriate to go to the emergency department.    I provided 13 minutes of non-face-to-face time during this encounter.    Worthy Rancher, MD

## 2019-09-26 ENCOUNTER — Ambulatory Visit
Admission: RE | Admit: 2019-09-26 | Discharge: 2019-09-26 | Disposition: A | Discharge status: Home / Self Care | Payer: Medicare HMO | Source: Ambulatory Visit | Attending: Family Medicine | Admitting: Family Medicine

## 2019-09-26 ENCOUNTER — Other Ambulatory Visit: Payer: Self-pay

## 2019-09-26 DIAGNOSIS — Z1231 Encounter for screening mammogram for malignant neoplasm of breast: Secondary | ICD-10-CM | POA: Diagnosis not present

## 2019-10-21 ENCOUNTER — Other Ambulatory Visit: Payer: Self-pay | Admitting: Family Medicine

## 2019-10-21 DIAGNOSIS — I10 Essential (primary) hypertension: Secondary | ICD-10-CM

## 2019-11-01 ENCOUNTER — Telehealth: Payer: Self-pay | Admitting: Family Medicine

## 2019-11-01 DIAGNOSIS — R05 Cough: Secondary | ICD-10-CM | POA: Diagnosis not present

## 2019-11-01 DIAGNOSIS — Z20828 Contact with and (suspected) exposure to other viral communicable diseases: Secondary | ICD-10-CM | POA: Diagnosis not present

## 2019-11-01 DIAGNOSIS — J029 Acute pharyngitis, unspecified: Secondary | ICD-10-CM | POA: Diagnosis not present

## 2019-11-01 NOTE — Telephone Encounter (Signed)
Patient called stating that her husband Rickya Ketler, DOB 01/29/1938) was sent to the hospital and has tested positive for COVID. Patient says she was told by hospital to call and let Dr Dettinger know to give advise on what patient should do.

## 2019-11-01 NOTE — Telephone Encounter (Signed)
Pt called  - she has slight runny nose - not out of the ordinary.  She is aware to go for testing and presume that she is positive as well until notified otherwise.

## 2019-11-06 ENCOUNTER — Telehealth: Payer: Self-pay | Admitting: Family Medicine

## 2019-11-06 NOTE — Telephone Encounter (Signed)
She should monitor for any symptoms and if she develops any shortness of breath or wheezing or anything like that she should go to the hospital but in the meantime she can take Mucinex and use an allergy pill such as Claritin to help with symptoms and to monitor for any worsening.  She can also use Tylenol and ibuprofen as needed.

## 2019-11-06 NOTE — Telephone Encounter (Signed)
FYI

## 2019-11-06 NOTE — Telephone Encounter (Signed)
Pt aware.

## 2019-11-09 DIAGNOSIS — U071 COVID-19: Secondary | ICD-10-CM | POA: Diagnosis not present

## 2019-12-17 DIAGNOSIS — R69 Illness, unspecified: Secondary | ICD-10-CM | POA: Diagnosis not present

## 2020-01-02 DIAGNOSIS — R69 Illness, unspecified: Secondary | ICD-10-CM | POA: Diagnosis not present

## 2020-01-14 ENCOUNTER — Other Ambulatory Visit: Payer: Self-pay

## 2020-01-14 ENCOUNTER — Ambulatory Visit: Payer: Medicare HMO | Admitting: *Deleted

## 2020-01-14 VITALS — BP 145/59 | HR 73

## 2020-01-14 DIAGNOSIS — I10 Essential (primary) hypertension: Secondary | ICD-10-CM

## 2020-01-14 NOTE — Progress Notes (Signed)
Patient in office today for blood pressure nurse visit. Visit with you on 2/26. Will bring BP log to appt.

## 2020-01-21 DIAGNOSIS — H2511 Age-related nuclear cataract, right eye: Secondary | ICD-10-CM | POA: Diagnosis not present

## 2020-01-21 DIAGNOSIS — H35372 Puckering of macula, left eye: Secondary | ICD-10-CM | POA: Diagnosis not present

## 2020-01-21 DIAGNOSIS — Z961 Presence of intraocular lens: Secondary | ICD-10-CM | POA: Diagnosis not present

## 2020-01-21 DIAGNOSIS — H04123 Dry eye syndrome of bilateral lacrimal glands: Secondary | ICD-10-CM | POA: Diagnosis not present

## 2020-02-06 ENCOUNTER — Other Ambulatory Visit: Payer: Self-pay

## 2020-02-07 ENCOUNTER — Ambulatory Visit (INDEPENDENT_AMBULATORY_CARE_PROVIDER_SITE_OTHER): Payer: Medicare HMO | Admitting: Family Medicine

## 2020-02-07 ENCOUNTER — Encounter: Payer: Self-pay | Admitting: Family Medicine

## 2020-02-07 VITALS — BP 152/71 | HR 57 | Temp 95.3°F | Ht 64.0 in | Wt 140.8 lb

## 2020-02-07 DIAGNOSIS — E782 Mixed hyperlipidemia: Secondary | ICD-10-CM

## 2020-02-07 DIAGNOSIS — I10 Essential (primary) hypertension: Secondary | ICD-10-CM

## 2020-02-07 DIAGNOSIS — E039 Hypothyroidism, unspecified: Secondary | ICD-10-CM | POA: Diagnosis not present

## 2020-02-07 MED ORDER — LOSARTAN POTASSIUM 50 MG PO TABS: 50.0000 mg | ORAL_TABLET | Freq: Every day | ORAL | 3 refills | Status: DC

## 2020-02-07 NOTE — Progress Notes (Signed)
BP (!) 152/71   Pulse (!) 57   Temp (!) 95.3 F (35.2 C) (Temporal)   Ht 5' 4" (1.626 m)   Wt 140 lb 12.8 oz (63.9 kg)   BMI 24.17 kg/m    Subjective:   Patient ID: [name], female    DOB: [birth year], 79 y.o.   MRN: [id number]  HPI: [name] is a 79 y.o. female presenting on 02/07/2020 for Hypertension (6 month follow up)   HPI Hypertension Patient is currently on atenolol and losartan, and their blood pressure today is 152/71. Patient denies any lightheadedness or dizziness. Patient denies headaches, blurred vision, chest pains, shortness of breath, or weakness. Denies any side effects from medication and is content with current medication.   Hypothyroidism recheck Patient is coming in for thyroid recheck today as well. They deny any issues with hair changes or heat or cold problems or diarrhea or constipation. They deny any chest pain or palpitations. They are currently on Euthyrox 100 micrograms   Hyperlipidemia Patient is coming in for recheck of his hyperlipidemia. The patient is currently taking omega-3's. They deny any issues with myalgias or history of liver damage from it. They deny any focal numbness or weakness or chest pain.   Patient is also been dealing with the loss of her husband a couple months ago he passed away from [facility] and that has been challenging for her.  She does have a lot of support and she is using that support readily but it still been more difficult and challenging.  Relevant past medical, surgical, family and social history reviewed and updated as indicated. Interim medical history since our last visit reviewed. Allergies and medications reviewed and updated.  Review of Systems  Constitutional: Negative for chills and fever.  HENT: Negative for congestion, ear discharge and ear pain.   Eyes: Negative for visual disturbance.  Respiratory: Negative for chest tightness and shortness of breath.   Cardiovascular: Negative  for chest pain and leg swelling.  Genitourinary: Negative for difficulty urinating and dysuria.  Musculoskeletal: Negative for back pain and gait problem.  Skin: Negative for rash.  Neurological: Negative for light-headedness and headaches.  Psychiatric/Behavioral: Positive for dysphoric mood. Negative for agitation, behavioral problems, self-injury, sleep disturbance and suicidal ideas.  All other systems reviewed and are negative.   Per HPI unless specifically indicated above   Allergies as of 02/07/2020      Reactions   Penicillins    Statins    Muscle problems Muscle problems   Sulfa Antibiotics    Sulfonamide Derivatives       Medication List       Accurate as of February 07, 2020  9:15 AM. If you have any questions, ask your nurse or doctor.        STOP taking these medications   lisinopril 20 MG tablet Commonly known as: ZESTRIL Stopped by: Fransisca Kaufmann Dettinger, MD     TAKE these medications   atenolol 50 MG tablet Commonly known as: TENORMIN Take 2 tablets (100 mg total) by mouth daily.   Euthyrox 100 MCG tablet Generic drug: levothyroxine TAKE 1 TABLET BY MOUTH ONCE DAILY BEFORE BREAKFAST   Fish Oil 600 MG Caps Take 1,200 mg by mouth 2 (two) times daily. 3 times per week   GLUCOSAMINE CHONDR COMPLEX PO Take 600 mg by mouth daily.   losartan 50 MG tablet Commonly known as: COZAAR Take 1 tablet (50 mg total) by mouth daily. Started by: Fransisca Kaufmann Dettinger,  MD   multivitamin-lutein Caps capsule Take 1 capsule by mouth daily.   OSCAL 500/200 D-3 PO Take 1 capsule by mouth. Daily at lunchtime   Turmeric 500 MG Caps Take 1 capsule by mouth daily.   Vitamin D 50 MCG (2000 UT) Caps Take 1 capsule by mouth daily.        Objective:   BP (!) 152/71   Pulse (!) 57   Temp (!) 95.3 F (35.2 C) (Temporal)   Ht 5' 4" (1.626 m)   Wt 140 lb 12.8 oz (63.9 kg)   BMI 24.17 kg/m   Wt Readings from Last 3 Encounters:  02/07/20 140 lb 12.8 oz (63.9 kg)    08/12/19 146 lb 9.6 oz (66.5 kg)  01/16/19 143 lb (64.9 kg)    Physical Exam Vitals and nursing note reviewed.  Constitutional:      General: She is not in acute distress.    Appearance: She is well-developed. She is not diaphoretic.  Eyes:     Conjunctiva/sclera: Conjunctivae normal.  Cardiovascular:     Rate and Rhythm: Normal rate and regular rhythm.     Heart sounds: Normal heart sounds. No murmur.  Pulmonary:     Effort: Pulmonary effort is normal. No respiratory distress.     Breath sounds: Normal breath sounds. No wheezing.  Musculoskeletal:        General: No tenderness. Normal range of motion.  Skin:    General: Skin is warm and dry.     Findings: No rash.  Neurological:     Mental Status: She is alert and oriented to person, place, and time.     Coordination: Coordination normal.  Psychiatric:        Behavior: Behavior normal.       Assessment & Plan:   Problem List Items Addressed This Visit      Cardiovascular and Mediastinum   Essential hypertension   Relevant Medications   losartan (COZAAR) 50 MG tablet   Other Relevant Orders   CMP14+EGFR     Endocrine   Hypothyroidism - Primary   Relevant Orders   CBC with Differential/Platelet   TSH     Other   Hyperlipidemia   Relevant Medications   losartan (COZAAR) 50 MG tablet   Other Relevant Orders   Lipid panel    Patient feels like she is getting little lung irritation medial cough but not much cough just more little lung irritation we will switch from lisinopril to losartan to see if that improves, if not we may send her to have cardiac evaluation.  She says this is been going on for months but definitely been more over the past few months since she lost her husband and she has been more sad and stressed.  Follow up plan: Return in about 6 months (around 08/06/2020), or if symptoms worsen or fail to improve, for Thyroid and cholesterol.  Counseling provided for all of the vaccine  components Orders Placed This Encounter  Procedures  . CBC with Differential/Platelet  . CMP14+EGFR  . Lipid panel  . TSH    Caryl Pina, MD [facility] 02/07/2020, 9:15 AM

## 2020-02-08 LAB — CMP14+EGFR
ALT: 15 IU/L (ref 0–32)
AST: 19 IU/L (ref 0–40)
Albumin/Globulin Ratio: 1.7 (ref 1.2–2.2)
Albumin: 4 g/dL (ref 3.7–4.7)
Alkaline Phosphatase: 56 IU/L (ref 39–117)
BUN/Creatinine Ratio: 17 (ref 12–28)
BUN: 12 mg/dL (ref 8–27)
Bilirubin Total: 0.4 mg/dL (ref 0.0–1.2)
CO2: 28 mmol/L (ref 20–29)
Calcium: 9.1 mg/dL (ref 8.7–10.3)
Chloride: 101 mmol/L (ref 96–106)
Creatinine, Ser: 0.71 mg/dL (ref 0.57–1.00)
GFR calc Af Amer: 94 mL/min/1.73
GFR calc non Af Amer: 81 mL/min/1.73
Globulin, Total: 2.4 g/dL (ref 1.5–4.5)
Glucose: 84 mg/dL (ref 65–99)
Potassium: 4.1 mmol/L (ref 3.5–5.2)
Sodium: 140 mmol/L (ref 134–144)
Total Protein: 6.4 g/dL (ref 6.0–8.5)

## 2020-02-08 LAB — LIPID PANEL
Chol/HDL Ratio: 4.6 ratio — ABNORMAL HIGH (ref 0.0–4.4)
Cholesterol, Total: 199 mg/dL (ref 100–199)
HDL: 43 mg/dL
LDL Chol Calc (NIH): 110 mg/dL — ABNORMAL HIGH (ref 0–99)
Triglycerides: 268 mg/dL — ABNORMAL HIGH (ref 0–149)
VLDL Cholesterol Cal: 46 mg/dL — ABNORMAL HIGH (ref 5–40)

## 2020-02-08 LAB — TSH: TSH: 1.75 u[IU]/mL (ref 0.450–4.500)

## 2020-02-08 LAB — CBC WITH DIFFERENTIAL/PLATELET
Basophils Absolute: 0.1 10*3/uL (ref 0.0–0.2)
Basos: 1 %
EOS (ABSOLUTE): 0.3 10*3/uL (ref 0.0–0.4)
Eos: 4 %
Hematocrit: 42 % (ref 34.0–46.6)
Hemoglobin: 14.1 g/dL (ref 11.1–15.9)
Immature Grans (Abs): 0 10*3/uL (ref 0.0–0.1)
Immature Granulocytes: 1 %
Lymphocytes Absolute: 1.8 10*3/uL (ref 0.7–3.1)
Lymphs: 27 %
MCH: 31.1 pg (ref 26.6–33.0)
MCHC: 33.6 g/dL (ref 31.5–35.7)
MCV: 93 fL (ref 79–97)
Monocytes Absolute: 0.6 10*3/uL (ref 0.1–0.9)
Monocytes: 9 %
Neutrophils Absolute: 3.9 10*3/uL (ref 1.4–7.0)
Neutrophils: 58 %
Platelets: 278 10*3/uL (ref 150–450)
RBC: 4.53 x10E6/uL (ref 3.77–5.28)
RDW: 11.9 % (ref 11.7–15.4)
WBC: 6.6 10*3/uL (ref 3.4–10.8)

## 2020-02-28 ENCOUNTER — Telehealth: Payer: Self-pay | Admitting: Family Medicine

## 2020-02-28 NOTE — Telephone Encounter (Signed)
Have her go ahead and double it and take 100 mg and keep an eye on the blood pressures and let me know how it goes.  We can send a new prescription and if that works.  Let me know next week how its going

## 2020-02-28 NOTE — Telephone Encounter (Signed)
Aware. 

## 2020-02-28 NOTE — Telephone Encounter (Signed)
Pt called to give Dr Dettinger her BP readings since starting on her new BP medication (Losartan)  140/80 on 02/11/20 142/77 on 02/21/20 160/93 on 02/26/20 170/89 on 02/27/20 161/84 today

## 2020-03-03 DIAGNOSIS — R69 Illness, unspecified: Secondary | ICD-10-CM | POA: Diagnosis not present

## 2020-03-05 ENCOUNTER — Telehealth: Payer: Self-pay | Admitting: Family Medicine

## 2020-03-05 NOTE — Telephone Encounter (Signed)
Patient aware, will continue to monitor and give [place] a call next week with her readings.

## 2020-03-05 NOTE — Telephone Encounter (Signed)
The numbers are up and down, I am okay with anything in the 140s and even the 150/90 is fine by me, the only number that is up higher than I would like it is the 170/90 and that is only 1 out of the 5, I would say continue to monitor and let me know how things are going but I am okay if it is anywhere from 150 and 90 and below.

## 2020-03-05 NOTE — Telephone Encounter (Signed)
Patient got her second COVID shot 3/12.   BP has been running high-  170/90   03-02-20 145/83 150/90 141/74 145/87  Please advise

## 2020-03-11 ENCOUNTER — Telehealth: Payer: Self-pay | Admitting: Family Medicine

## 2020-03-11 NOTE — Telephone Encounter (Signed)
Those readings look great except for the one today, continue to monitor but I am happy with those blood pressures

## 2020-03-12 ENCOUNTER — Encounter: Payer: Self-pay | Admitting: Family

## 2020-03-12 ENCOUNTER — Ambulatory Visit (INDEPENDENT_AMBULATORY_CARE_PROVIDER_SITE_OTHER): Payer: Medicare HMO | Admitting: Family

## 2020-03-12 ENCOUNTER — Ambulatory Visit: Payer: Medicare HMO

## 2020-03-12 ENCOUNTER — Other Ambulatory Visit: Payer: Self-pay

## 2020-03-12 DIAGNOSIS — I10 Essential (primary) hypertension: Secondary | ICD-10-CM

## 2020-03-12 DIAGNOSIS — R69 Illness, unspecified: Secondary | ICD-10-CM | POA: Diagnosis not present

## 2020-03-12 DIAGNOSIS — F411 Generalized anxiety disorder: Secondary | ICD-10-CM

## 2020-03-12 MED ORDER — LOSARTAN POTASSIUM 100 MG PO TABS: 100.0000 mg | ORAL_TABLET | Freq: Every day | ORAL | 3 refills | Status: DC

## 2020-03-12 MED ORDER — BUSPIRONE HCL 5 MG PO TABS: 5.0000 mg | ORAL_TABLET | Freq: Two times a day (BID) | ORAL | 2 refills | Status: DC | PRN

## 2020-03-12 MED ORDER — LOSARTAN POTASSIUM 50 MG PO TABS: 100.0000 mg | ORAL_TABLET | Freq: Every day | ORAL | 0 refills | Status: DC

## 2020-03-12 NOTE — Telephone Encounter (Signed)
Patient has a follow up appointment scheduled to bring her BP machine and have triage compare to our machine.

## 2020-03-12 NOTE — Progress Notes (Signed)
Patient here today to have blood pressure checked and compare it to her meter that she has at home.  Patient is concerned because her blood pressure has been elevated at home, 200's/100, patient is unsure of exact number.  Blood pressure today was 196/88, pulse 85.  Patient rested for a few minutes and her blood pressure was rechecked at 178/82, pulse 84.  Patient reports that last week Dr. Warrick Parisian increased her Losartan from 50 mg per day to 100 mg per day.  She is also taking Atenolol 50 mg twice daily.  An appointment was scheduled for a phone visit today with Evelina Dun at 12:10 pm.

## 2020-03-12 NOTE — Progress Notes (Signed)
Virtual Visit via telephone Note Due to COVID-19 pandemic this visit was conducted virtually. This visit type was conducted due to national recommendations for restrictions regarding the COVID-19 Pandemic (e.g. social distancing, sheltering in place) in an effort to limit this patient's exposure and mitigate transmission in our community. All issues noted in this document were discussed and addressed.  A physical exam was not performed with this format.  I connected with [name] on 03/12/20 at 12:31 pm by telephone and verified that I am speaking with the correct person using two identifiers. [name] is currently located at home and no one is currently with her during visit. The provider, Evelina Dun, FNP is located in their office at time of visit.  I discussed the limitations, risks, security and privacy concerns of performing an evaluation and management service by telephone and the availability of in person appointments. I also discussed with the patient that there may be a patient responsible charge related to this service. The patient expressed understanding and agreed to proceed.   History and Present Illness:  Pt calls the office today with elevated BP readings every now and then. She reports she starts feeling "weird" and will check her BP and it will be elevated. She reports her husband died of COVID in Jan 03, 2020 and she has had to have multiple dental procedures over the last couple of weeks. She reports she usually is not an  anxious person, but states with everything going on she can tell she is more anxious than normal. Her PCP over the last few weeks has increased her Atenolol to 100 mg from 50 mg and her Losartan to 100 mg from 50 mg. She also states that some days her BP is 120's/70's and is only elevated one or two days a week.  Hypertension This is a chronic problem. The current episode started more than 1 year ago. The problem has been waxing and  waning since onset. The problem is uncontrolled. Associated symptoms include malaise/fatigue. Pertinent negatives include no peripheral edema or shortness of breath. Agents associated with hypertension include thyroid hormones. Risk factors for coronary artery disease include sedentary lifestyle and post-menopausal state. Past treatments include angiotensin blockers. The current treatment provides moderate improvement.      Review of Systems  Constitutional: Positive for malaise/fatigue.  Respiratory: Negative for shortness of breath.      Observations/Objective: No SOB or distress noted  Assessment and Plan: 1. Essential hypertension Given elevated BP, I believe this is more related to her anxiety. Given everything she is currently going through. We will give Buspar 5 mg to take as needed when she feels anxious. If this continues she will need to be seen face to face to have an EKG or holter monitor placed to rule out other causes.  Follow up with PCP in 1 month - losartan (COZAAR) 100 MG tablet; Take 1 tablet (100 mg total) by mouth daily.  Dispense: 90 tablet; Refill: 3  2. GAD (generalized anxiety disorder) Stress management discussed Take Buspar as needed, she does not wish to take this long term and only for the next few months to help her through everything she is going through now.  - busPIRone (BUSPAR) 5 MG tablet; Take 1 tablet (5 mg total) by mouth 2 (two) times daily as needed.  Dispense: 60 tablet; Refill: 2     I discussed the assessment and treatment plan with the patient. The patient was provided an opportunity to ask questions and  all were answered. The patient agreed with the plan and demonstrated an understanding of the instructions.   The patient was advised to call back or seek an in-person evaluation if the symptoms worsen or if the condition fails to improve as anticipated.  The above assessment and management plan was discussed with the patient. The patient  verbalized understanding of and has agreed to the management plan. Patient is aware to call the clinic if symptoms persist or worsen. Patient is aware when to return to the clinic for a follow-up visit. Patient educated on when it is appropriate to go to the emergency department.   Time call ended:  12:54 pm  I provided 23 minutes of non-face-to-face time during this encounter.    Evelina Dun, FNP

## 2020-03-16 ENCOUNTER — Telehealth: Payer: Self-pay | Admitting: Family Medicine

## 2020-03-16 NOTE — Telephone Encounter (Signed)
Patient has a follow up appointment scheduled with pcp for later this month for blood pressure recheck.

## 2020-04-07 ENCOUNTER — Other Ambulatory Visit: Payer: Self-pay

## 2020-04-07 ENCOUNTER — Encounter: Payer: Self-pay | Admitting: Family Medicine

## 2020-04-07 ENCOUNTER — Ambulatory Visit (INDEPENDENT_AMBULATORY_CARE_PROVIDER_SITE_OTHER): Payer: Medicare HMO | Admitting: Family Medicine

## 2020-04-07 VITALS — BP 143/62 | HR 69 | Temp 97.6°F | Ht 64.0 in | Wt 138.0 lb

## 2020-04-07 DIAGNOSIS — F411 Generalized anxiety disorder: Secondary | ICD-10-CM

## 2020-04-07 DIAGNOSIS — I10 Essential (primary) hypertension: Secondary | ICD-10-CM

## 2020-04-07 DIAGNOSIS — R69 Illness, unspecified: Secondary | ICD-10-CM | POA: Diagnosis not present

## 2020-04-07 MED ORDER — BUSPIRONE HCL 10 MG PO TABS: 10.0000 mg | ORAL_TABLET | Freq: Two times a day (BID) | ORAL | 1 refills | Status: DC | PRN

## 2020-04-07 NOTE — Progress Notes (Signed)
BP (!) 143/62   Pulse 69   Temp 97.6 F (36.4 C)   Ht 5\' 4"  (1.626 m)   Wt 138 lb (62.6 kg)   SpO2 100%   BMI 23.69 kg/m    Subjective:   Patient ID: [name], female    DOB: 1940/12/23, 79 y.o.   MRN: [id number]  HPI: [name] is a 79 y.o. female presenting on 04/07/2020 for Hypertension and Anxiety   HPI Hypertension Patient is currently on losartan and atenolol, and their blood pressure today is 143/62, she tends to run between 106 and 150 at home but occasionally has 1 higher.. Patient denies any lightheadedness or dizziness. Patient denies headaches, blurred vision, chest pains, shortness of breath, or weakness. Denies any side effects from medication and is content with current medication.   Anxiety Patient is coming in for recheck of anxiety, she still feels like she gets the heart flutters especially with the recent passing of her husband and trying to deal with some of his estate stuff comments made it very challenging for her and she gets her heart all fluttered when she feels like it building up or when she is working on some of the paperwork or some of the legal stuff.  She started taking the buspirone and that helped a little bit and then it seemed to fade, she is on a low dose, she denies any side effects. Depression screen [facility] 2/9 04/07/2020 02/07/2020 08/30/2019 08/12/2019 01/16/2019  Decreased Interest 0 0 0 0 0  Down, Depressed, Hopeless 0 0 0 0 0  PHQ - 2 Score 0 0 0 0 0     Relevant past medical, surgical, family and social history reviewed and updated as indicated. Interim medical history since our last visit reviewed. Allergies and medications reviewed and updated.  Review of Systems  Constitutional: Negative for chills and fever.  Eyes: Negative for visual disturbance.  Respiratory: Negative for chest tightness and shortness of breath.   Cardiovascular: Negative for chest pain and leg swelling.  Musculoskeletal: Negative for back  pain and gait problem.  Skin: Negative for rash.  Neurological: Negative for light-headedness and headaches.  Psychiatric/Behavioral: Positive for dysphoric mood. Negative for agitation, behavioral problems, self-injury, sleep disturbance and suicidal ideas. The patient is nervous/anxious.   All other systems reviewed and are negative.   Per HPI unless specifically indicated above   Allergies as of 04/07/2020      Reactions   Penicillins    Statins    Muscle problems Muscle problems   Sulfa Antibiotics    Sulfonamide Derivatives       Medication List       Accurate as of April 07, 2020 10:36 AM. If you have any questions, ask your nurse or doctor.        atenolol 50 MG tablet Commonly known as: TENORMIN Take 100 mg by mouth daily. What changed: Another medication with the same name was removed. Continue taking this medication, and follow the directions you see here. Changed by: Fransisca Kaufmann Berle Fitz, MD   busPIRone 10 MG tablet Commonly known as: BUSPAR Take 1 tablet (10 mg total) by mouth 2 (two) times daily as needed. What changed:   medication strength  how much to take Changed by: Fransisca Kaufmann Kellin Fifer, MD   Euthyrox 100 MCG tablet Generic drug: levothyroxine TAKE 1 TABLET BY MOUTH ONCE DAILY BEFORE BREAKFAST   Fish Oil 600 MG Caps Take 1,200 mg by mouth 2 (two) times daily. 3  times per week   GLUCOSAMINE CHONDR COMPLEX PO Take 600 mg by mouth daily.   losartan 100 MG tablet Commonly known as: COZAAR Take 1 tablet (100 mg total) by mouth daily.   multivitamin-lutein Caps capsule Take 1 capsule by mouth daily.   OSCAL 500/200 D-3 PO Take 1 capsule by mouth. Daily at lunchtime   Turmeric 500 MG Caps Take 1 capsule by mouth daily.   Vitamin D 50 MCG (2000 UT) Caps Take 1 capsule by mouth daily.        Objective:   BP (!) 143/62   Pulse 69   Temp 97.6 F (36.4 C)   Ht 5\' 4"  (1.626 m)   Wt 138 lb (62.6 kg)   SpO2 100%   BMI 23.69 kg/m   Wt  Readings from Last 3 Encounters:  04/07/20 138 lb (62.6 kg)  02/07/20 140 lb 12.8 oz (63.9 kg)  08/12/19 146 lb 9.6 oz (66.5 kg)    Physical Exam Vitals and nursing note reviewed.  Constitutional:      General: She is not in acute distress.    Appearance: She is well-developed. She is not diaphoretic.  Eyes:     Conjunctiva/sclera: Conjunctivae normal.  Musculoskeletal:        General: No tenderness. Normal range of motion.  Skin:    General: Skin is warm and dry.     Findings: No rash.  Neurological:     Mental Status: She is alert and oriented to person, place, and time.     Coordination: Coordination normal.  Psychiatric:        Mood and Affect: Mood is anxious. Mood is not depressed.        Behavior: Behavior normal.        Thought Content: Thought content does not include suicidal ideation. Thought content does not include suicidal plan.       Assessment & Plan:   Problem List Items Addressed This Visit      Cardiovascular and Mediastinum   Essential hypertension   Relevant Medications   atenolol (TENORMIN) 50 MG tablet    Other Visit Diagnoses    GAD (generalized anxiety disorder)    -  Primary   Relevant Medications   busPIRone (BUSPAR) 10 MG tablet      Sounds like anxiety is building up, especially when is related to working on her husband's estate.  Increase buspirone and see how it goes for her. Follow up plan: Return in about 4 weeks (around 05/05/2020), or if symptoms worsen or fail to improve, for Anxiety recheck.  Counseling provided for all of the vaccine components No orders of the defined types were placed in this encounter.   Caryl Pina, MD [facility] 04/07/2020, 10:36 AM

## 2020-05-13 ENCOUNTER — Other Ambulatory Visit: Payer: Self-pay

## 2020-05-13 ENCOUNTER — Ambulatory Visit (INDEPENDENT_AMBULATORY_CARE_PROVIDER_SITE_OTHER): Payer: Medicare HMO | Admitting: Family Medicine

## 2020-05-13 ENCOUNTER — Encounter: Payer: Self-pay | Admitting: Family Medicine

## 2020-05-13 VITALS — BP 141/76 | HR 77 | Temp 97.9°F | Ht 64.0 in | Wt 136.0 lb

## 2020-05-13 DIAGNOSIS — F411 Generalized anxiety disorder: Secondary | ICD-10-CM | POA: Diagnosis not present

## 2020-05-13 DIAGNOSIS — I10 Essential (primary) hypertension: Secondary | ICD-10-CM | POA: Diagnosis not present

## 2020-05-13 DIAGNOSIS — R69 Illness, unspecified: Secondary | ICD-10-CM | POA: Diagnosis not present

## 2020-05-13 MED ORDER — BUSPIRONE HCL 10 MG PO TABS: 10.0000 mg | ORAL_TABLET | Freq: Two times a day (BID) | ORAL | 1 refills | Status: DC | PRN

## 2020-05-13 NOTE — Progress Notes (Signed)
BP (!) 141/76   Pulse 77   Temp 97.9 F (36.6 C)   Ht 5\' 4"  (1.626 m)   Wt 136 lb (61.7 kg)   SpO2 100%   BMI 23.34 kg/m    Subjective:   Patient ID: [name], female    DOB: Jun 04, 1941, 79 y.o.   MRN: [id number]  HPI: [name] is a 79 y.o. female presenting on 05/13/2020 for Medical Management of Chronic Issues, Hypertension, and Anxiety   HPI Hypertension Patient is currently on losartan and atenolol, and their blood pressure today is 141/76 although at home she is been running mostly in the 120s. Patient denies any lightheadedness or dizziness. Patient denies headaches, blurred vision, chest pains, shortness of breath, or weakness. Denies any side effects from medication and is content with current medication.   Anxiety Patient is coming in for recheck on anxiety, she does not necessarily feel anxious but buspirone has definitely reduced her blood pressure significantly.  She says she is content with that except for she does get a little bit of a fuzzy feeling in her head although it is not too bad she would like to continue with it and will recheck in a few months.  Relevant past medical, surgical, family and social history reviewed and updated as indicated. Interim medical history since our last visit reviewed. Allergies and medications reviewed and updated.  Review of Systems  Constitutional: Negative for chills and fever.  Eyes: Negative for visual disturbance.  Respiratory: Negative for chest tightness and shortness of breath.   Cardiovascular: Negative for chest pain and leg swelling.  Musculoskeletal: Negative for back pain and gait problem.  Skin: Negative for rash.  Neurological: Negative for light-headedness and headaches.  Psychiatric/Behavioral: Negative for agitation, behavioral problems, dysphoric mood, self-injury, sleep disturbance and suicidal ideas. The patient is not nervous/anxious.   All other systems reviewed and are  negative.   Per HPI unless specifically indicated above   Allergies as of 05/13/2020      Reactions   Penicillins    Statins    Muscle problems Muscle problems   Sulfa Antibiotics    Sulfonamide Derivatives       Medication List       Accurate as of May 13, 2020 10:52 AM. If you have any questions, ask your nurse or doctor.        atenolol 50 MG tablet Commonly known as: TENORMIN Take 100 mg by mouth daily.   busPIRone 10 MG tablet Commonly known as: BUSPAR Take 1 tablet (10 mg total) by mouth 2 (two) times daily as needed.   Euthyrox 100 MCG tablet Generic drug: levothyroxine TAKE 1 TABLET BY MOUTH ONCE DAILY BEFORE BREAKFAST   Fish Oil 600 MG Caps Take 1,200 mg by mouth 2 (two) times daily. 3 times per week   GLUCOSAMINE CHONDR COMPLEX PO Take 600 mg by mouth daily.   losartan 100 MG tablet Commonly known as: COZAAR Take 1 tablet (100 mg total) by mouth daily.   multivitamin-lutein Caps capsule Take 1 capsule by mouth daily.   OSCAL 500/200 D-3 PO Take 1 capsule by mouth. Daily at lunchtime   Turmeric 500 MG Caps Take 1 capsule by mouth daily.   Vitamin D 50 MCG (2000 UT) Caps Take 1 capsule by mouth daily.        Objective:   BP (!) 141/76   Pulse 77   Temp 97.9 F (36.6 C)   Ht 5\' 4"  (1.626 m)  Wt 136 lb (61.7 kg)   SpO2 100%   BMI 23.34 kg/m   Wt Readings from Last 3 Encounters:  05/13/20 136 lb (61.7 kg)  04/07/20 138 lb (62.6 kg)  02/07/20 140 lb 12.8 oz (63.9 kg)    Physical Exam Vitals and nursing note reviewed.  Constitutional:      General: She is not in acute distress.    Appearance: She is well-developed. She is not diaphoretic.  Eyes:     Conjunctiva/sclera: Conjunctivae normal.  Cardiovascular:     Rate and Rhythm: Normal rate and regular rhythm.     Heart sounds: Normal heart sounds. No murmur.  Pulmonary:     Effort: Pulmonary effort is normal. No respiratory distress.     Breath sounds: Normal breath sounds. No  wheezing.  Musculoskeletal:        General: No tenderness. Normal range of motion.  Skin:    General: Skin is warm and dry.     Findings: No rash.  Neurological:     Mental Status: She is alert and oriented to person, place, and time.     Coordination: Coordination normal.  Psychiatric:        Behavior: Behavior normal.       Assessment & Plan:   Problem List Items Addressed This Visit      Cardiovascular and Mediastinum   Essential hypertension - Primary     Other   GAD (generalized anxiety disorder)   Relevant Medications   busPIRone (BUSPAR) 10 MG tablet    Continue the buspirone for now Continue current blood pressure medications, she will monitor side effects looking of it significant, if it becomes significantly switch to an SSRI  Follow up plan: Return in about 3 months (around 08/13/2020), or if symptoms worsen or fail to improve, for Physical exam.  Counseling provided for all of the vaccine components No orders of the defined types were placed in this encounter.   Caryl Pina, MD [facility] 05/13/2020, 10:52 AM

## 2020-07-08 ENCOUNTER — Telehealth: Payer: Self-pay | Admitting: Family Medicine

## 2020-07-08 NOTE — Telephone Encounter (Signed)
Nurse Abigail Butts contacted patient by phone and advised to rinse eyes and gave poison control number to call.

## 2020-08-07 ENCOUNTER — Ambulatory Visit (INDEPENDENT_AMBULATORY_CARE_PROVIDER_SITE_OTHER): Payer: Medicare HMO | Admitting: Family Medicine

## 2020-08-07 ENCOUNTER — Encounter: Payer: Self-pay | Admitting: Family Medicine

## 2020-08-07 ENCOUNTER — Other Ambulatory Visit: Payer: Self-pay

## 2020-08-07 VITALS — BP 145/73 | HR 68 | Temp 97.8°F | Ht 64.0 in | Wt 140.1 lb

## 2020-08-07 DIAGNOSIS — I10 Essential (primary) hypertension: Secondary | ICD-10-CM | POA: Diagnosis not present

## 2020-08-07 DIAGNOSIS — R69 Illness, unspecified: Secondary | ICD-10-CM | POA: Diagnosis not present

## 2020-08-07 DIAGNOSIS — E039 Hypothyroidism, unspecified: Secondary | ICD-10-CM

## 2020-08-07 DIAGNOSIS — F411 Generalized anxiety disorder: Secondary | ICD-10-CM | POA: Diagnosis not present

## 2020-08-07 DIAGNOSIS — E782 Mixed hyperlipidemia: Secondary | ICD-10-CM | POA: Diagnosis not present

## 2020-08-07 MED ORDER — LEVOTHYROXINE SODIUM 100 MCG PO TABS: 100.0000 ug | ORAL_TABLET | Freq: Every day | ORAL | 3 refills | Status: DC

## 2020-08-07 MED ORDER — LOSARTAN POTASSIUM 100 MG PO TABS: 100.0000 mg | ORAL_TABLET | Freq: Every day | ORAL | 3 refills | Status: DC

## 2020-08-07 MED ORDER — BUSPIRONE HCL 10 MG PO TABS: 10.0000 mg | ORAL_TABLET | Freq: Two times a day (BID) | ORAL | 1 refills | Status: DC | PRN

## 2020-08-07 NOTE — Progress Notes (Signed)
BP (!) 145/73   Pulse 68   Temp 97.8 F (36.6 C)   Ht 5' 4" (1.626 m)   Wt 140 lb 2 oz (63.6 kg)   SpO2 99%   BMI 24.05 kg/m    Subjective:   Patient ID: [name], female    DOB: 1941/10/23, 79 y.o.   MRN: [id number]  HPI: [name] is a 79 y.o. female presenting on 08/07/2020 for Medical Management of Chronic Issues, Hypertension (BP readings good at home), and Anxiety (weaned off of Buspar. would like to keep on hand)   HPI Hypothyroidism recheck Patient is coming in for thyroid recheck today as well. They deny any issues with hair changes or heat or cold problems or diarrhea or constipation. They deny any chest pain or palpitations. They are currently on levothyroxine 100 micrograms   Hypertension Patient is currently on losartan and atenolol, and their blood pressure today is 145/73. Patient denies any lightheadedness or dizziness. Patient denies headaches, blurred vision, chest pains, shortness of breath, or weakness. Denies any side effects from medication and is content with current medication.   Hyperlipidemia Patient is coming in for recheck of his hyperlipidemia. The patient is currently taking fish oil. They deny any issues with myalgias or history of liver damage from it. They deny any focal numbness or weakness or chest pain.   Anxiety Patient is coming in today for anxiety recheck.  She has been taking the buspirone although she did stop it a few weeks ago and she feels like she has not needed it and doing well without it.  She did have some ringing in her ears and thinks it may be attributed to that but then when she stopped it has not necessarily improved, we discussed hearing loss and getting that checked.  Relevant past medical, surgical, family and social history reviewed and updated as indicated. Interim medical history since our last visit reviewed. Allergies and medications reviewed and updated.  Review of Systems  Constitutional:  Negative for chills and fever.  Eyes: Negative for redness and visual disturbance.  Respiratory: Negative for chest tightness and shortness of breath.   Cardiovascular: Negative for chest pain and leg swelling.  Genitourinary: Negative for difficulty urinating and dysuria.  Musculoskeletal: Negative for back pain and gait problem.  Skin: Negative for rash.  Neurological: Negative for light-headedness and headaches.  Psychiatric/Behavioral: Negative for agitation, behavioral problems, dysphoric mood, self-injury, sleep disturbance and suicidal ideas. The patient is not nervous/anxious.   All other systems reviewed and are negative.   Per HPI unless specifically indicated above   Allergies as of 08/07/2020      Reactions   Penicillins    Statins    Muscle problems Muscle problems   Sulfa Antibiotics    Sulfonamide Derivatives       Medication List       Accurate as of August 07, 2020  8:32 AM. If you have any questions, ask your nurse or doctor.        atenolol 50 MG tablet Commonly known as: TENORMIN Take 100 mg by mouth daily.   busPIRone 10 MG tablet Commonly known as: BUSPAR Take 1 tablet (10 mg total) by mouth 2 (two) times daily as needed.   Euthyrox 100 MCG tablet Generic drug: levothyroxine TAKE 1 TABLET BY MOUTH ONCE DAILY BEFORE BREAKFAST   Fish Oil 600 MG Caps Take 1,200 mg by mouth 2 (two) times daily. 3 times per week   Inglis  PO Take 600 mg by mouth daily.   losartan 100 MG tablet Commonly known as: COZAAR Take 1 tablet (100 mg total) by mouth daily.   multivitamin-lutein Caps capsule Take 1 capsule by mouth daily.   OSCAL 500/200 D-3 PO Take 1 capsule by mouth. Daily at lunchtime   Turmeric 500 MG Caps Take 1 capsule by mouth daily.   Vitamin D 50 MCG (2000 UT) Caps Take 1 capsule by mouth daily.        Objective:   BP (!) 145/73   Pulse 68   Temp 97.8 F (36.6 C)   Ht 5' 4" (1.626 m)   Wt 140 lb 2 oz (63.6  kg)   SpO2 99%   BMI 24.05 kg/m   Wt Readings from Last 3 Encounters:  08/07/20 140 lb 2 oz (63.6 kg)  05/13/20 136 lb (61.7 kg)  04/07/20 138 lb (62.6 kg)    Physical Exam Vitals and nursing note reviewed.  Constitutional:      General: She is not in acute distress.    Appearance: She is well-developed. She is not diaphoretic.  Eyes:     Conjunctiva/sclera: Conjunctivae normal.  Cardiovascular:     Rate and Rhythm: Normal rate and regular rhythm.     Heart sounds: Normal heart sounds. No murmur heard.   Pulmonary:     Effort: Pulmonary effort is normal. No respiratory distress.     Breath sounds: Normal breath sounds. No wheezing.  Musculoskeletal:        General: No tenderness. Normal range of motion.  Skin:    General: Skin is warm and dry.     Findings: No rash.  Neurological:     Mental Status: She is alert and oriented to person, place, and time.     Coordination: Coordination normal.  Psychiatric:        Behavior: Behavior normal.       Assessment & Plan:   Problem List Items Addressed This Visit      Cardiovascular and Mediastinum   Essential hypertension   Relevant Medications   losartan (COZAAR) 100 MG tablet   Other Relevant Orders   CMP14+EGFR     Endocrine   Hypothyroidism - Primary   Relevant Medications   levothyroxine (EUTHYROX) 100 MCG tablet   Other Relevant Orders   TSH     Other   Hyperlipidemia   Relevant Medications   losartan (COZAAR) 100 MG tablet   Other Relevant Orders   Lipid panel   GAD (generalized anxiety disorder)   Relevant Medications   busPIRone (BUSPAR) 10 MG tablet   Other Relevant Orders   CBC with Differential/Platelet      Continue current medication, no changes, patient seems to be doing well with blood pressure at home. Follow up plan: Return in about 6 months (around 02/07/2021), or if symptoms worsen or fail to improve, for Hypertension and cholesterol and thyroid.  Counseling provided for all of the  vaccine components No orders of the defined types were placed in this encounter.   Caryl Pina, MD [facility] 08/07/2020, 8:32 AM

## 2020-08-13 ENCOUNTER — Encounter: Payer: Medicare HMO | Admitting: Family Medicine

## 2020-08-14 ENCOUNTER — Other Ambulatory Visit: Payer: Self-pay | Admitting: Family Medicine

## 2020-08-14 ENCOUNTER — Other Ambulatory Visit: Payer: Self-pay

## 2020-08-14 ENCOUNTER — Other Ambulatory Visit: Payer: Medicare HMO

## 2020-08-14 DIAGNOSIS — F411 Generalized anxiety disorder: Secondary | ICD-10-CM

## 2020-08-14 DIAGNOSIS — Z1231 Encounter for screening mammogram for malignant neoplasm of breast: Secondary | ICD-10-CM

## 2020-08-14 DIAGNOSIS — E039 Hypothyroidism, unspecified: Secondary | ICD-10-CM

## 2020-08-14 DIAGNOSIS — I10 Essential (primary) hypertension: Secondary | ICD-10-CM | POA: Diagnosis not present

## 2020-08-14 DIAGNOSIS — E782 Mixed hyperlipidemia: Secondary | ICD-10-CM

## 2020-08-14 DIAGNOSIS — R69 Illness, unspecified: Secondary | ICD-10-CM | POA: Diagnosis not present

## 2020-08-15 LAB — CBC WITH DIFFERENTIAL/PLATELET
Basophils Absolute: 0.1 10*3/uL (ref 0.0–0.2)
Basos: 1 %
EOS (ABSOLUTE): 0.4 10*3/uL (ref 0.0–0.4)
Eos: 6 %
Hematocrit: 46 % (ref 34.0–46.6)
Hemoglobin: 15.2 g/dL (ref 11.1–15.9)
Immature Grans (Abs): 0 10*3/uL (ref 0.0–0.1)
Immature Granulocytes: 0 %
Lymphocytes Absolute: 1.8 10*3/uL (ref 0.7–3.1)
Lymphs: 29 %
MCH: 30.3 pg (ref 26.6–33.0)
MCHC: 33 g/dL (ref 31.5–35.7)
MCV: 92 fL (ref 79–97)
Monocytes Absolute: 0.4 10*3/uL (ref 0.1–0.9)
Monocytes: 6 %
Neutrophils Absolute: 3.6 10*3/uL (ref 1.4–7.0)
Neutrophils: 58 %
Platelets: 280 10*3/uL (ref 150–450)
RBC: 5.02 x10E6/uL (ref 3.77–5.28)
RDW: 12 % (ref 11.7–15.4)
WBC: 6.3 10*3/uL (ref 3.4–10.8)

## 2020-08-15 LAB — CMP14+EGFR
ALT: 17 IU/L (ref 0–32)
AST: 17 IU/L (ref 0–40)
Albumin/Globulin Ratio: 1.7 (ref 1.2–2.2)
Albumin: 4.5 g/dL (ref 3.7–4.7)
Alkaline Phosphatase: 60 IU/L (ref 48–121)
BUN/Creatinine Ratio: 21 (ref 12–28)
BUN: 15 mg/dL (ref 8–27)
Bilirubin Total: 0.7 mg/dL (ref 0.0–1.2)
CO2: 29 mmol/L (ref 20–29)
Calcium: 9.2 mg/dL (ref 8.7–10.3)
Chloride: 101 mmol/L (ref 96–106)
Creatinine, Ser: 0.72 mg/dL (ref 0.57–1.00)
GFR calc Af Amer: 91 mL/min/{1.73_m2} (ref >59)
GFR calc non Af Amer: 79 mL/min/{1.73_m2} (ref >59)
Globulin, Total: 2.7 g/dL (ref 1.5–4.5)
Glucose: 105 mg/dL — ABNORMAL HIGH (ref 65–99)
Potassium: 4.5 mmol/L (ref 3.5–5.2)
Sodium: 139 mmol/L (ref 134–144)
Total Protein: 7.2 g/dL (ref 6.0–8.5)

## 2020-08-15 LAB — LIPID PANEL
Chol/HDL Ratio: 4 ratio (ref 0.0–4.4)
Cholesterol, Total: 206 mg/dL — ABNORMAL HIGH (ref 100–199)
HDL: 51 mg/dL (ref >39)
LDL Chol Calc (NIH): 135 mg/dL — ABNORMAL HIGH (ref 0–99)
Triglycerides: 112 mg/dL (ref 0–149)
VLDL Cholesterol Cal: 20 mg/dL (ref 5–40)

## 2020-08-15 LAB — TSH: TSH: 3.21 u[IU]/mL (ref 0.450–4.500)

## 2020-08-20 ENCOUNTER — Other Ambulatory Visit: Payer: Self-pay

## 2020-08-20 DIAGNOSIS — E782 Mixed hyperlipidemia: Secondary | ICD-10-CM

## 2020-08-20 MED ORDER — EZETIMIBE 10 MG PO TABS: 10.0000 mg | ORAL_TABLET | Freq: Every day | ORAL | 1 refills | Status: DC

## 2020-08-27 ENCOUNTER — Encounter: Payer: Self-pay | Admitting: Family Medicine

## 2020-08-27 ENCOUNTER — Other Ambulatory Visit: Payer: Self-pay

## 2020-08-27 ENCOUNTER — Ambulatory Visit (INDEPENDENT_AMBULATORY_CARE_PROVIDER_SITE_OTHER): Payer: Medicare HMO | Admitting: Family Medicine

## 2020-08-27 VITALS — BP 159/78 | HR 78 | Temp 98.0°F | Ht 64.0 in | Wt 139.0 lb

## 2020-08-27 DIAGNOSIS — C4491 Basal cell carcinoma of skin, unspecified: Secondary | ICD-10-CM | POA: Diagnosis not present

## 2020-08-27 NOTE — Progress Notes (Signed)
BP (!) 159/78   Pulse 78   Temp 98 F (36.7 C)   Ht 5\' 4"  (1.626 m)   Wt 139 lb (63 kg)   SpO2 98%   BMI 23.86 kg/m    Subjective:   Patient ID: [name], female    DOB: [birth year], 79 y.o.   MRN: [id number]  HPI: [name] is a 79 y.o. female presenting on 08/27/2020 for lesion right arm (red, no bleeding, present for one year)   HPI   Patient presented to clinic with 32mm nodule on the anterior lateral aspect of her right elbow. Patient states it has been there since January and has gotten larger. She lost her husband in December so she does not know the exact date. Patient denies any pain unless the nodule is squeezed along with no discharge or significant erythema. Patient denies a history or skin cancer but does have a history of nevus. She has an appointment with dermatology in November.    Relevant past medical, surgical, family and social history reviewed and updated as indicated. Interim medical history since our last visit reviewed. Allergies and medications reviewed and updated.  Review of Systems  Constitutional: Negative for chills and fever.  Cardiovascular: Negative for chest pain and palpitations.  Gastrointestinal: Negative for diarrhea, nausea and vomiting.  Musculoskeletal: Negative for myalgias.  Skin: Positive for color change.       See HPI    Neurological: Positive for light-headedness. Negative for weakness.    Per HPI unless specifically indicated above   Allergies as of 08/27/2020      Reactions   Penicillins    Statins    Muscle problems Muscle problems   Sulfa Antibiotics    Sulfonamide Derivatives       Medication List       Accurate as of August 27, 2020 11:59 PM. If you have any questions, ask your nurse or doctor.        atenolol 50 MG tablet Commonly known as: TENORMIN Take 100 mg by mouth daily.   busPIRone 10 MG tablet Commonly known as: BUSPAR Take 1 tablet (10 mg total) by mouth 2 (two)  times daily as needed.   ezetimibe 10 MG tablet Commonly known as: ZETIA Take 1 tablet (10 mg total) by mouth daily.   Fish Oil 600 MG Caps Take 1,200 mg by mouth 2 (two) times daily. 3 times per week   GLUCOSAMINE CHONDR COMPLEX PO Take 600 mg by mouth daily.   levothyroxine 100 MCG tablet Commonly known as: Euthyrox Take 1 tablet (100 mcg total) by mouth daily before breakfast.   losartan 100 MG tablet Commonly known as: COZAAR Take 1 tablet (100 mg total) by mouth daily.   multivitamin-lutein Caps capsule Take 1 capsule by mouth daily.   OSCAL 500/200 D-3 PO Take 1 capsule by mouth. Daily at lunchtime   Turmeric 500 MG Caps Take 1 capsule by mouth daily.   Vitamin D 50 MCG (2000 UT) Caps Take 1 capsule by mouth daily.        Objective:   BP (!) 159/78   Pulse 78   Temp 98 F (36.7 C)   Ht 5\' 4"  (1.626 m)   Wt 139 lb (63 kg)   SpO2 98%   BMI 23.86 kg/m   Wt Readings from Last 3 Encounters:  08/27/20 139 lb (63 kg)  08/07/20 140 lb 2 oz (63.6 kg)  05/13/20 136 lb (61.7 kg)    Physical  Exam Constitutional:      General: She is not in acute distress.    Appearance: Normal appearance. She is normal weight.  Skin:    General: Skin is warm and dry.     Findings: Lesion present.          Comments: 67mm pearly papular lesion on the anterior lateral aspect of the right elbow with rolled boarders, central erosion and mild telangiectasias  Neurological:     Mental Status: She is alert.       Assessment & Plan:   Problem List Items Addressed This Visit    None    Visit Diagnoses    Basal cell carcinoma (BCC), unspecified site    -  Primary     Plan: Patient's condition was discussed with him and the following plan was determined:    1.  We informed patient that this is a slow growing skin cancer and okay to see Dermatology in November. If symptoms get worse we informed her she can call the office to schedule for removal.   Plan was discussed with  Dr. Caryl Pina, MD who is in agreement with stated plan.      Maxie Better PA-S    Follow up plan: Return if symptoms worsen or fail to improve.  Counseling provided for all of the vaccine components No orders of the defined types were placed in this encounter.  Patient seen and examined with Maxie Better PA student, agree with assessment and plan above.  Agree that the lesion most likely looks like a basal cell, patient already has a dermatologist and recommended that he go there to have it removed. Caryl Pina, MD [facility] 09/02/2020, 9:35 PM

## 2020-09-02 DIAGNOSIS — R69 Illness, unspecified: Secondary | ICD-10-CM | POA: Diagnosis not present

## 2020-09-04 ENCOUNTER — Other Ambulatory Visit: Payer: Self-pay | Admitting: Family Medicine

## 2020-09-04 DIAGNOSIS — G4700 Insomnia, unspecified: Secondary | ICD-10-CM

## 2020-09-15 DIAGNOSIS — C44612 Basal cell carcinoma of skin of right upper limb, including shoulder: Secondary | ICD-10-CM | POA: Diagnosis not present

## 2020-09-18 ENCOUNTER — Other Ambulatory Visit: Payer: Self-pay | Admitting: Family Medicine

## 2020-10-12 ENCOUNTER — Other Ambulatory Visit: Payer: Self-pay

## 2020-10-12 ENCOUNTER — Ambulatory Visit
Admission: RE | Admit: 2020-10-12 | Discharge: 2020-10-12 | Disposition: A | Discharge status: Home / Self Care | Payer: Medicare HMO | Source: Ambulatory Visit | Attending: Family Medicine | Admitting: Family Medicine

## 2020-10-12 DIAGNOSIS — Z1231 Encounter for screening mammogram for malignant neoplasm of breast: Secondary | ICD-10-CM

## 2020-10-16 ENCOUNTER — Encounter: Payer: Self-pay | Admitting: Family Medicine

## 2020-10-16 ENCOUNTER — Ambulatory Visit (INDEPENDENT_AMBULATORY_CARE_PROVIDER_SITE_OTHER): Payer: Medicare HMO | Admitting: Family Medicine

## 2020-10-16 DIAGNOSIS — I10 Essential (primary) hypertension: Secondary | ICD-10-CM

## 2020-10-16 MED ORDER — LOSARTAN POTASSIUM 50 MG PO TABS: 50.0000 mg | ORAL_TABLET | Freq: Every day | ORAL | 3 refills | Status: DC

## 2020-10-16 MED ORDER — ESZOPICLONE 2 MG PO TABS: 2.0000 mg | ORAL_TABLET | Freq: Every evening | ORAL | 0 refills | Status: DC | PRN

## 2020-10-16 NOTE — Progress Notes (Signed)
Virtual Visit via telephone Note  I connected with [name] on 10/16/20 at 1547 by telephone and verified that I am speaking with the correct person using two identifiers. [name] is currently located at home and patient are currently with her during visit. The provider, Fransisca Kaufmann Corrigan Kretschmer, MD is located in their office at time of visit.  Call ended at 1558  I discussed the limitations, risks, security and privacy concerns of performing an evaluation and management service by telephone and the availability of in person appointments. I also discussed with the patient that there may be a patient responsible charge related to this service. The patient expressed understanding and agreed to proceed.  HR 75-76, O2 95-96 History and Present Illness: Hypertension Patient is currently on atenolol and losartan, and their blood pressure today is 118/60. Patient denies any lightheadedness or dizziness. Patient denies headaches, blurred vision, chest pains, shortness of breath, or weakness. Denies any side effects from medication and is content with current medication.   Patient is feeling shortness of breath on exertion from walking short distances even to the mailbox. Patient's is tired quicker and feels like energy is down.   No diagnosis found.  Outpatient Encounter Medications as of 10/16/2020  Medication Sig  . atenolol (TENORMIN) 50 MG tablet Take 2 tablets by mouth once daily  . busPIRone (BUSPAR) 10 MG tablet Take 1 tablet (10 mg total) by mouth 2 (two) times daily as needed.  . Calcium Carbonate-Vitamin D (OSCAL 500/200 D-3 PO) Take 1 capsule by mouth. Daily at lunchtime  . Cholecalciferol (VITAMIN D) 2000 units CAPS Take 1 capsule by mouth daily.  Marland Kitchen ezetimibe (ZETIA) 10 MG tablet Take 1 tablet (10 mg total) by mouth daily. (Patient not taking: Reported on 08/27/2020)  . Glucosamine-Chondroitin (GLUCOSAMINE CHONDR COMPLEX PO) Take 600 mg by mouth daily.  Marland Kitchen  levothyroxine (EUTHYROX) 100 MCG tablet Take 1 tablet (100 mcg total) by mouth daily before breakfast.  . losartan (COZAAR) 100 MG tablet Take 1 tablet (100 mg total) by mouth daily.  . multivitamin-lutein (OCUVITE-LUTEIN) CAPS Take 1 capsule by mouth daily.  . Omega-3 Fatty Acids (FISH OIL) 600 MG CAPS Take 1,200 mg by mouth 2 (two) times daily. 3 times per week  . Turmeric 500 MG CAPS Take 1 capsule by mouth daily.   No facility-administered encounter medications on file as of 10/16/2020.    Review of Systems  Constitutional: Positive for fatigue. Negative for chills and fever.  Eyes: Negative for visual disturbance.  Respiratory: Positive for shortness of breath. Negative for chest tightness and wheezing.   Cardiovascular: Negative for chest pain and leg swelling.  Musculoskeletal: Negative for back pain and gait problem.  Skin: Negative for rash.  Neurological: Negative for light-headedness and headaches.  Psychiatric/Behavioral: Negative for agitation and behavioral problems.  All other systems reviewed and are negative.   Observations/Objective: Patient sounds comfortable in no acute distress  Assessment and Plan: Problem List Items Addressed This Visit      Cardiovascular and Mediastinum   Essential hypertension - Primary   Relevant Medications   losartan (COZAAR) 50 MG tablet      Concerns that her energy might be down because her blood pressure is on the lower side for her, will allow it to run up a little bit more by cutting the losartan in half. She will call back in a week and let me know how her blood pressures are running and how she is feeling Follow up  plan: Return if symptoms worsen or fail to improve.     I discussed the assessment and treatment plan with the patient. The patient was provided an opportunity to ask questions and all were answered. The patient agreed with the plan and demonstrated an understanding of the instructions.   The patient was advised  to call back or seek an in-person evaluation if the symptoms worsen or if the condition fails to improve as anticipated.  The above assessment and management plan was discussed with the patient. The patient verbalized understanding of and has agreed to the management plan. Patient is aware to call the clinic if symptoms persist or worsen. Patient is aware when to return to the clinic for a follow-up visit. Patient educated on when it is appropriate to go to the emergency department.    I provided 11 minutes of non-face-to-face time during this encounter.    Worthy Rancher, MD

## 2020-10-21 ENCOUNTER — Other Ambulatory Visit: Payer: Self-pay

## 2020-10-21 ENCOUNTER — Other Ambulatory Visit (INDEPENDENT_AMBULATORY_CARE_PROVIDER_SITE_OTHER): Payer: Medicare HMO

## 2020-10-21 DIAGNOSIS — Z780 Asymptomatic menopausal state: Secondary | ICD-10-CM

## 2020-10-21 NOTE — Progress Notes (Signed)
d 

## 2020-10-22 DIAGNOSIS — Z780 Asymptomatic menopausal state: Secondary | ICD-10-CM | POA: Diagnosis not present

## 2020-10-27 ENCOUNTER — Telehealth: Payer: Self-pay

## 2020-10-27 NOTE — Telephone Encounter (Signed)
Pt will bring in her BP log for [place] to give to Dr Dettinger to have him review from her televisit with Dr Warrick Parisian.

## 2020-11-03 ENCOUNTER — Telehealth: Payer: Self-pay

## 2020-11-03 NOTE — Telephone Encounter (Signed)
Pt says she dropped off her BP reading sheet for Dr Dettinger last week and wants to know if Dr Dettinger has had a chance to review it? Says her BP has been better since cutting her medicine in half but says it is still not where it needs to be.  Wants to know what Dr Dettinger advises based off of her BP reading sheet.  (Pt aware that Dr Warrick Parisian is off today.)

## 2020-11-04 NOTE — Telephone Encounter (Signed)
Patient aware and verbalized understanding. °

## 2020-11-04 NOTE — Telephone Encounter (Signed)
I think she has the occasional high in the occasional low but I would not change anything right now based on this blood pressure sheet.  Continue to monitor, if she feels any lightheadedness or dizziness let me know or if anything worsens.  But based on these I would not change a thing.

## 2020-12-19 ENCOUNTER — Other Ambulatory Visit: Payer: Self-pay | Admitting: Family Medicine

## 2021-01-05 DIAGNOSIS — C44319 Basal cell carcinoma of skin of other parts of face: Secondary | ICD-10-CM | POA: Diagnosis not present

## 2021-01-05 DIAGNOSIS — L821 Other seborrheic keratosis: Secondary | ICD-10-CM | POA: Diagnosis not present

## 2021-01-05 DIAGNOSIS — Z85828 Personal history of other malignant neoplasm of skin: Secondary | ICD-10-CM | POA: Diagnosis not present

## 2021-01-05 DIAGNOSIS — L309 Dermatitis, unspecified: Secondary | ICD-10-CM | POA: Diagnosis not present

## 2021-01-05 DIAGNOSIS — D485 Neoplasm of uncertain behavior of skin: Secondary | ICD-10-CM | POA: Diagnosis not present

## 2021-01-05 DIAGNOSIS — L570 Actinic keratosis: Secondary | ICD-10-CM | POA: Diagnosis not present

## 2021-01-14 DIAGNOSIS — C441121 Basal cell carcinoma of skin of right upper eyelid, including canthus: Secondary | ICD-10-CM | POA: Diagnosis not present

## 2021-01-14 DIAGNOSIS — L570 Actinic keratosis: Secondary | ICD-10-CM | POA: Diagnosis not present

## 2021-01-26 DIAGNOSIS — H04123 Dry eye syndrome of bilateral lacrimal glands: Secondary | ICD-10-CM | POA: Diagnosis not present

## 2021-01-26 DIAGNOSIS — H35372 Puckering of macula, left eye: Secondary | ICD-10-CM | POA: Diagnosis not present

## 2021-01-26 DIAGNOSIS — H2511 Age-related nuclear cataract, right eye: Secondary | ICD-10-CM | POA: Diagnosis not present

## 2021-01-26 DIAGNOSIS — Z961 Presence of intraocular lens: Secondary | ICD-10-CM | POA: Diagnosis not present

## 2021-02-05 ENCOUNTER — Other Ambulatory Visit: Payer: Self-pay

## 2021-02-05 ENCOUNTER — Telehealth: Payer: Self-pay | Admitting: *Deleted

## 2021-02-05 ENCOUNTER — Ambulatory Visit (INDEPENDENT_AMBULATORY_CARE_PROVIDER_SITE_OTHER): Payer: Medicare HMO | Admitting: Family Medicine

## 2021-02-05 ENCOUNTER — Encounter: Payer: Self-pay | Admitting: Family Medicine

## 2021-02-05 VITALS — BP 139/68 | HR 72 | Ht 64.0 in | Wt 142.0 lb

## 2021-02-05 DIAGNOSIS — I10 Essential (primary) hypertension: Secondary | ICD-10-CM | POA: Diagnosis not present

## 2021-02-05 DIAGNOSIS — Z79891 Long term (current) use of opiate analgesic: Secondary | ICD-10-CM | POA: Diagnosis not present

## 2021-02-05 DIAGNOSIS — E039 Hypothyroidism, unspecified: Secondary | ICD-10-CM | POA: Diagnosis not present

## 2021-02-05 DIAGNOSIS — R69 Illness, unspecified: Secondary | ICD-10-CM | POA: Diagnosis not present

## 2021-02-05 DIAGNOSIS — E782 Mixed hyperlipidemia: Secondary | ICD-10-CM

## 2021-02-05 DIAGNOSIS — F5104 Psychophysiologic insomnia: Secondary | ICD-10-CM

## 2021-02-05 DIAGNOSIS — G4700 Insomnia, unspecified: Secondary | ICD-10-CM | POA: Insufficient documentation

## 2021-02-05 MED ORDER — ATENOLOL 50 MG PO TABS
100.0000 mg | ORAL_TABLET | Freq: Every day | ORAL | 3 refills | Status: DC
Start: 2021-02-05 — End: 2022-01-17

## 2021-02-05 MED ORDER — LOSARTAN POTASSIUM 50 MG PO TABS: 50.0000 mg | ORAL_TABLET | Freq: Every day | ORAL | 3 refills | Status: DC

## 2021-02-05 MED ORDER — ESZOPICLONE 2 MG PO TABS: 2.0000 mg | ORAL_TABLET | Freq: Every evening | ORAL | 3 refills | Status: DC | PRN

## 2021-02-05 MED ORDER — LEVOTHYROXINE SODIUM 100 MCG PO TABS: 100.0000 ug | ORAL_TABLET | Freq: Every day | ORAL | 3 refills | Status: DC

## 2021-02-05 NOTE — Telephone Encounter (Signed)
See additional call on 02/25

## 2021-02-05 NOTE — Telephone Encounter (Addendum)
PA in process   (Key: [id number]) - [id number] Need help? Call us at [phone] Status Sent to Plan today [facility] plan will fax you a determination, typically within 1 to 5 business days.  Drug Eszopiclone 2MG  tablets

## 2021-02-05 NOTE — Progress Notes (Signed)
BP 139/68   Pulse 72   Ht 5' 4"  (1.626 m)   Wt 142 lb (64.4 kg)   SpO2 100%   BMI 24.37 kg/m    Subjective:   Patient ID: [name], female    DOB: 05-Jul-1941, 80 y.o.   MRN: [id number]  HPI: [name] is a 80 y.o. female presenting on 02/05/2021 for Medical Management of Chronic Issues, Hypertension, and Hypothyroidism   HPI Hypertension Patient is currently on atenolol and losartan, and their blood pressure today is 139/68 and runs well at home. Patient denies any lightheadedness or dizziness. Patient denies headaches, blurred vision, chest pains, shortness of breath, or weakness. Denies any side effects from medication and is content with current medication.   Hyperlipidemia Patient is coming in for recheck of his hyperlipidemia. The patient is currently taking fish oil. They deny any issues with myalgias or history of liver damage from it. They deny any focal numbness or weakness or chest pain.   Hypothyroidism recheck Patient is coming in for thyroid recheck today as well. They deny any issues with hair changes or heat or cold problems or diarrhea or constipation. They deny any chest pain or palpitations. They are currently on levothyroxine 100 micrograms   Insomnia Current rx-Lunesta # meds rx-30 Effectiveness of current meds-works well, does not need to use every night Adverse reactions form meds-none  Pill count performed-No Last drug screen -N/A ( high risk q26m moderate risk q663mlow risk yearly ) Urine drug [place] today- Yes [place]  If yes were their any concerning findings? -None  No flowsheet data found.   Controlled substance contract signed on: Today  Relevant past medical, surgical, family and social history reviewed and updated as indicated. Interim medical history since our last visit reviewed. Allergies and medications reviewed and updated.  Review of Systems  Constitutional: Negative for chills and  fever.  Eyes: Negative for redness and visual disturbance.  Respiratory: Negative for chest tightness and shortness of breath.   Cardiovascular: Negative for chest pain and leg swelling.  Genitourinary: Negative for difficulty urinating and dysuria.  Musculoskeletal: Negative for back pain and gait problem.  Skin: Negative for rash.  Neurological: Negative for light-headedness and headaches.  Psychiatric/Behavioral: Positive for sleep disturbance. Negative for agitation, behavioral problems, self-injury and suicidal ideas.  All other systems reviewed and are negative.   Per HPI unless specifically indicated above   Allergies as of 02/05/2021      Reactions   Penicillins    Statins    Muscle problems Muscle problems   Sulfa Antibiotics    Sulfonamide Derivatives       Medication List       Accurate as of February 05, 2021  8:52 AM. If you have any questions, ask your nurse or doctor.        STOP taking these medications   busPIRone 10 MG tablet Commonly known as: BUSPAR Stopped by: JoWorthy RancherMD   ezetimibe 10 MG tablet Commonly known as: ZETIA Stopped by: JoFransisca Kaufmannettinger, MD     TAKE these medications   atenolol 50 MG tablet Commonly known as: TENORMIN Take 2 tablets (100 mg total) by mouth daily.   eszopiclone 2 MG Tabs tablet Commonly known as: Lunesta Take 1 tablet (2 mg total) by mouth at bedtime as needed for sleep. Take immediately before bedtime   Fish Oil 600 MG Caps Take 1,200 mg by mouth 2 (two) times daily. 3 times  per week   GLUCOSAMINE CHONDR COMPLEX PO Take 600 mg by mouth daily.   levothyroxine 100 MCG tablet Commonly known as: Euthyrox Take 1 tablet (100 mcg total) by mouth daily before breakfast.   losartan 50 MG tablet Commonly known as: COZAAR Take 1 tablet (50 mg total) by mouth daily.   multivitamin-lutein Caps capsule Take 1 capsule by mouth daily.   OSCAL 500/200 D-3 PO Take 1 capsule by mouth. Daily at lunchtime    Turmeric 500 MG Caps Take 1 capsule by mouth daily.   Vitamin D 50 MCG (2000 UT) Caps Take 1 capsule by mouth daily.        Objective:   BP 139/68   Pulse 72   Ht 5' 4"  (1.626 m)   Wt 142 lb (64.4 kg)   SpO2 100%   BMI 24.37 kg/m   Wt Readings from Last 3 Encounters:  02/05/21 142 lb (64.4 kg)  08/27/20 139 lb (63 kg)  08/07/20 140 lb 2 oz (63.6 kg)    Physical Exam Vitals and nursing note reviewed.  Constitutional:      General: She is not in acute distress.    Appearance: She is well-developed and well-nourished. She is not diaphoretic.  Eyes:     Extraocular Movements: EOM normal.     Conjunctiva/sclera: Conjunctivae normal.  Cardiovascular:     Rate and Rhythm: Normal rate and regular rhythm.     Pulses: Intact distal pulses.     Heart sounds: Normal heart sounds. No murmur heard.   Pulmonary:     Effort: Pulmonary effort is normal. No respiratory distress.     Breath sounds: Normal breath sounds. No wheezing.  Musculoskeletal:        General: No tenderness or edema. Normal range of motion.  Skin:    General: Skin is warm and dry.     Findings: No rash.  Neurological:     Mental Status: She is alert and oriented to person, place, and time.     Coordination: Coordination normal.  Psychiatric:        Mood and Affect: Mood and affect normal.        Behavior: Behavior normal.      Assessment & Plan:   Problem List Items Addressed This Visit      Cardiovascular and Mediastinum   Essential hypertension   Relevant Medications   atenolol (TENORMIN) 50 MG tablet   losartan (COZAAR) 50 MG tablet   Other Relevant Orders   CMP14+EGFR     Endocrine   Hypothyroidism   Relevant Medications   atenolol (TENORMIN) 50 MG tablet   levothyroxine (EUTHYROX) 100 MCG tablet   Other Relevant Orders   CBC with Differential/Platelet   TSH     Other   Hyperlipidemia - Primary   Relevant Medications   atenolol (TENORMIN) 50 MG tablet   losartan (COZAAR) 50 MG  tablet   Other Relevant Orders   Lipid panel   Insomnia   Relevant Orders   TSH   ToxASSURE Select 13 (MW), Urine      Continue current medication, seems to be doing well, follow-up in 6 months Follow up plan: Return in about 6 months (around 08/05/2021), or if symptoms worsen or fail to improve, for Hypertension and thyroid and hyperlipidemia.  Counseling provided for all of the vaccine components Orders Placed This Encounter  Procedures  . CBC with Differential/Platelet  . CMP14+EGFR  . Lipid panel  . TSH  . ToxASSURE Select 13 (MW),  Urine    Caryl Pina, MD [facility] 02/05/2021, 8:52 AM

## 2021-02-06 LAB — LIPID PANEL
Chol/HDL Ratio: 4.8 ratio — ABNORMAL HIGH (ref 0.0–4.4)
Cholesterol, Total: 234 mg/dL — ABNORMAL HIGH (ref 100–199)
HDL: 49 mg/dL (ref >39)
LDL Chol Calc (NIH): 155 mg/dL — ABNORMAL HIGH (ref 0–99)
Triglycerides: 168 mg/dL — ABNORMAL HIGH (ref 0–149)
VLDL Cholesterol Cal: 30 mg/dL (ref 5–40)

## 2021-02-06 LAB — CBC WITH DIFFERENTIAL/PLATELET
Basophils Absolute: 0.1 10*3/uL (ref 0.0–0.2)
Basos: 1 %
EOS (ABSOLUTE): 0.4 10*3/uL (ref 0.0–0.4)
Eos: 5 %
Hematocrit: 45.2 % (ref 34.0–46.6)
Hemoglobin: 15.1 g/dL (ref 11.1–15.9)
Immature Grans (Abs): 0 10*3/uL (ref 0.0–0.1)
Immature Granulocytes: 0 %
Lymphocytes Absolute: 2.2 10*3/uL (ref 0.7–3.1)
Lymphs: 28 %
MCH: 30.1 pg (ref 26.6–33.0)
MCHC: 33.4 g/dL (ref 31.5–35.7)
MCV: 90 fL (ref 79–97)
Monocytes Absolute: 0.5 10*3/uL (ref 0.1–0.9)
Monocytes: 6 %
Neutrophils Absolute: 4.7 10*3/uL (ref 1.4–7.0)
Neutrophils: 60 %
Platelets: 261 10*3/uL (ref 150–450)
RBC: 5.02 x10E6/uL (ref 3.77–5.28)
RDW: 11.8 % (ref 11.7–15.4)
WBC: 7.8 10*3/uL (ref 3.4–10.8)

## 2021-02-06 LAB — CMP14+EGFR
ALT: 19 IU/L (ref 0–32)
AST: 22 IU/L (ref 0–40)
Albumin/Globulin Ratio: 1.8 (ref 1.2–2.2)
Albumin: 4.3 g/dL (ref 3.7–4.7)
Alkaline Phosphatase: 56 IU/L (ref 44–121)
BUN/Creatinine Ratio: 17 (ref 12–28)
BUN: 12 mg/dL (ref 8–27)
Bilirubin Total: 0.7 mg/dL (ref 0.0–1.2)
CO2: 25 mmol/L (ref 20–29)
Calcium: 9.6 mg/dL (ref 8.7–10.3)
Chloride: 100 mmol/L (ref 96–106)
Creatinine, Ser: 0.71 mg/dL (ref 0.57–1.00)
GFR calc Af Amer: 93 mL/min/{1.73_m2} (ref >59)
GFR calc non Af Amer: 81 mL/min/{1.73_m2} (ref >59)
Globulin, Total: 2.4 g/dL (ref 1.5–4.5)
Glucose: 106 mg/dL — ABNORMAL HIGH (ref 65–99)
Potassium: 4.7 mmol/L (ref 3.5–5.2)
Sodium: 140 mmol/L (ref 134–144)
Total Protein: 6.7 g/dL (ref 6.0–8.5)

## 2021-02-06 LAB — TSH: TSH: 3.03 u[IU]/mL (ref 0.450–4.500)

## 2021-02-08 ENCOUNTER — Other Ambulatory Visit: Payer: Self-pay | Admitting: Family Medicine

## 2021-02-08 MED ORDER — EZETIMIBE 10 MG PO TABS: 10.0000 mg | ORAL_TABLET | Freq: Every day | ORAL | 3 refills | Status: DC

## 2021-02-11 NOTE — Telephone Encounter (Signed)
Med APPROVED from 12/12/20-12/11/21 WM aware

## 2021-02-15 LAB — TOXASSURE SELECT 13 (MW), URINE

## 2021-03-15 ENCOUNTER — Encounter: Payer: Self-pay | Admitting: Family Medicine

## 2021-03-15 ENCOUNTER — Other Ambulatory Visit: Payer: Self-pay

## 2021-03-15 ENCOUNTER — Ambulatory Visit (INDEPENDENT_AMBULATORY_CARE_PROVIDER_SITE_OTHER): Payer: Medicare HMO | Admitting: Family Medicine

## 2021-03-15 VITALS — BP 167/76 | HR 66 | Temp 97.8°F | Ht 64.0 in | Wt 141.6 lb

## 2021-03-15 DIAGNOSIS — M5431 Sciatica, right side: Secondary | ICD-10-CM | POA: Diagnosis not present

## 2021-03-15 MED ORDER — BETAMETHASONE SOD PHOS & ACET 6 (3-3) MG/ML IJ SUSP: 6.0000 mg | Freq: Once | INTRAMUSCULAR | Status: DC

## 2021-03-15 NOTE — Progress Notes (Signed)
Subjective:  Patient ID: [name], female    DOB: 1941-07-30  Age: 80 y.o. MRN: [id number]  CC: Back Pain (Right lower, effecting right leg)   HPI [name] presents for RLB pain. Just started Zetia. Sopped it on 3/20 due to pain in right shoulder and right lower back.  .Pulls and hurts at RL back with movement. Can be up a few minutes and sometimes I gets better. Some relief with ibuprofen plus tylenol  Depression screen [facility] 2/9 03/15/2021 02/05/2021 08/27/2020  Decreased Interest 0 0 0  Down, Depressed, Hopeless 0 0 0  PHQ - 2 Score 0 0 0    History [name] has a past medical history of Hyperlipidemia, Hypertension, and Thyroid disease.   [name] has a past surgical history that includes Abdominal hysterectomy; Appendectomy; and tosillectomy.   Her family history includes Cancer in her mother; Heart disease in her mother; Parkinson's disease in her father.[name] reports that [name] has never smoked. [name] has never used smokeless tobacco. [name] reports current alcohol use of about 1.0 standard drink of alcohol per week. [name] reports that [name] does not use drugs.    ROS Review of Systems  Constitutional: Positive for activity change (due to back pain).  HENT: Negative.   Respiratory: Negative for shortness of breath.   Cardiovascular: Negative for chest pain.  Gastrointestinal: Negative for abdominal pain.  Musculoskeletal: Positive for back pain (right buttocks, radiating into the right posterolateral thigh.).    Objective:  BP (!) 167/76   Pulse 66   Temp 97.8 F (36.6 C)   Ht 5\' 4"  (1.626 m)   Wt 141 lb 9.6 oz (64.2 kg)   SpO2 99%   BMI 24.31 kg/m   BP Readings from Last 3 Encounters:  03/15/21 (!) 167/76  02/05/21 139/68  08/27/20 (!) 159/78    Wt Readings from Last 3 Encounters:  03/15/21 141 lb 9.6 oz (64.2 kg)  02/05/21 142 lb (64.4 kg)  08/27/20 139 lb (63 kg)     Physical Exam Constitutional:      General: [name] is in acute distress (due to  pain).  HENT:     Head: Normocephalic.  Cardiovascular:     Rate and Rhythm: Normal rate and regular rhythm.  Abdominal:     General: There is no distension.  Musculoskeletal:        General: Tenderness (right sciatic notch, positive R SLR) present. No signs of injury.  Skin:    General: Skin is warm and dry.  Neurological:     Mental Status: [name] is alert and oriented to person, place, and time.  Psychiatric:        Behavior: Behavior normal.       Assessment & Plan:   [name] was seen today for back pain.  Diagnoses and all orders for this visit:  Sciatica of right side -     betamethasone acetate-betamethasone sodium phosphate (CELESTONE) injection 6 mg   Can also use heat, gentle stretching and OTC ibuprofen and moderate amounts as needed as long as taken with food.  Review of blood work shows normal kidney function within the last month.    I am having [name] maintain her multivitamin-lutein, Turmeric, Calcium Carbonate-Vitamin D (OSCAL 500/200 D-3 PO), Vitamin D, Glucosamine-Chondroitin (GLUCOSAMINE CHONDR COMPLEX PO), Fish Oil, atenolol, eszopiclone, levothyroxine, losartan, and ezetimibe. We will continue to administer betamethasone acetate-betamethasone sodium phosphate.  Allergies as of 03/15/2021      Reactions   Penicillins  Statins    Muscle problems Muscle problems   Sulfa Antibiotics    Sulfonamide Derivatives       Medication List       Accurate as of March 15, 2021 12:48 PM. If you have any questions, ask your nurse or doctor.        atenolol 50 MG tablet Commonly known as: TENORMIN Take 2 tablets (100 mg total) by mouth daily.   eszopiclone 2 MG Tabs tablet Commonly known as: Lunesta Take 1 tablet (2 mg total) by mouth at bedtime as needed for sleep. Take immediately before bedtime   ezetimibe 10 MG tablet Commonly known as: Zetia Take 1 tablet (10 mg total) by mouth daily.   Fish Oil 600 MG Caps Take 1,200 mg by mouth 2 (two)  times daily. 3 times per week   GLUCOSAMINE CHONDR COMPLEX PO Take 600 mg by mouth daily.   levothyroxine 100 MCG tablet Commonly known as: Euthyrox Take 1 tablet (100 mcg total) by mouth daily before breakfast.   losartan 50 MG tablet Commonly known as: COZAAR Take 1 tablet (50 mg total) by mouth daily.   multivitamin-lutein Caps capsule Take 1 capsule by mouth daily.   OSCAL 500/200 D-3 PO Take 1 capsule by mouth. Daily at lunchtime   Turmeric 500 MG Caps Take 1 capsule by mouth daily.   Vitamin D 50 MCG (2000 UT) Caps Take 1 capsule by mouth daily.        Follow-up: Return if symptoms worsen or fail to improve.  Claretta Fraise, M.D.

## 2021-03-29 ENCOUNTER — Other Ambulatory Visit: Payer: Self-pay | Admitting: Family Medicine

## 2021-04-01 ENCOUNTER — Other Ambulatory Visit: Payer: Self-pay | Admitting: Family Medicine

## 2021-05-05 ENCOUNTER — Other Ambulatory Visit: Payer: Self-pay

## 2021-05-05 ENCOUNTER — Ambulatory Visit (INDEPENDENT_AMBULATORY_CARE_PROVIDER_SITE_OTHER): Payer: Medicare HMO | Admitting: *Deleted

## 2021-05-05 DIAGNOSIS — Z23 Encounter for immunization: Secondary | ICD-10-CM

## 2021-05-05 NOTE — Progress Notes (Signed)
Shingrix given and patient tolerated well.  

## 2021-07-13 DIAGNOSIS — Z1283 Encounter for screening for malignant neoplasm of skin: Secondary | ICD-10-CM | POA: Diagnosis not present

## 2021-07-13 DIAGNOSIS — L570 Actinic keratosis: Secondary | ICD-10-CM | POA: Diagnosis not present

## 2021-07-13 DIAGNOSIS — L821 Other seborrheic keratosis: Secondary | ICD-10-CM | POA: Diagnosis not present

## 2021-07-13 DIAGNOSIS — L814 Other melanin hyperpigmentation: Secondary | ICD-10-CM | POA: Diagnosis not present

## 2021-08-05 ENCOUNTER — Encounter: Payer: Self-pay | Admitting: Family Medicine

## 2021-08-05 ENCOUNTER — Ambulatory Visit (INDEPENDENT_AMBULATORY_CARE_PROVIDER_SITE_OTHER): Payer: Medicare HMO | Admitting: Family Medicine

## 2021-08-05 ENCOUNTER — Telehealth: Payer: Self-pay

## 2021-08-05 ENCOUNTER — Other Ambulatory Visit: Payer: Self-pay

## 2021-08-05 VITALS — BP 137/78 | HR 71 | Temp 97.7°F | Resp 20 | Ht 64.0 in | Wt 143.0 lb

## 2021-08-05 DIAGNOSIS — E782 Mixed hyperlipidemia: Secondary | ICD-10-CM | POA: Diagnosis not present

## 2021-08-05 DIAGNOSIS — I10 Essential (primary) hypertension: Secondary | ICD-10-CM | POA: Diagnosis not present

## 2021-08-05 DIAGNOSIS — E039 Hypothyroidism, unspecified: Secondary | ICD-10-CM | POA: Diagnosis not present

## 2021-08-05 DIAGNOSIS — Z23 Encounter for immunization: Secondary | ICD-10-CM

## 2021-08-05 NOTE — Telephone Encounter (Signed)
Patient would like to know if there are any resources available to help with her Zetia.  Her cost is almost $300.00 per month.

## 2021-08-05 NOTE — Telephone Encounter (Signed)
Patient aware, coupon mailed to patient

## 2021-08-05 NOTE — Telephone Encounter (Signed)
Please go to [facility] or have patient Print rx coupon for [facility]--zetia generic is around $15-20 per month with that  [facility] should be able to process that Don't run insurance, use [facility] for just zetia  No other resources available for zetia  Thanks!

## 2021-08-05 NOTE — Progress Notes (Signed)
BP 137/78   Pulse 71   Temp 97.7 F (36.5 C) (Temporal)   Resp 20   Ht '5\' 4"'$  (1.626 m)   Wt 143 lb (64.9 kg)   SpO2 96%   BMI 24.55 kg/m    Subjective:   Patient ID: [name], female    DOB: Mar 16, 1941, 80 y.o.   MRN: [id number]  HPI: [name] is a 80 y.o. female presenting on 08/05/2021 for Medical Management of Chronic Issues   HPI Hypothyroidism recheck Patient is coming in for thyroid recheck today as well. They deny any issues with hair changes or heat or cold problems or diarrhea or constipation. They deny any chest pain or palpitations. They are currently on levothyroxine 100 micrograms   Hypertension Patient is currently on losartan, and their blood pressure today is 137/78. Patient denies any lightheadedness or dizziness. Patient denies headaches, blurred vision, chest pains, shortness of breath, or weakness. Denies any side effects from medication and is content with current medication.   Hyperlipidemia Patient is coming in for recheck of his hyperlipidemia. The patient is currently taking Zetia although she does admit she does not take it all the time because of cost. They deny any issues with myalgias or history of liver damage from it. They deny any focal numbness or weakness or chest pain.   Relevant past medical, surgical, family and social history reviewed and updated as indicated. Interim medical history since our last visit reviewed. Allergies and medications reviewed and updated.  Review of Systems  Constitutional:  Negative for chills and fever.  Eyes:  Negative for visual disturbance.  Respiratory:  Negative for chest tightness and shortness of breath.   Cardiovascular:  Negative for chest pain and leg swelling.  Musculoskeletal:  Negative for back pain and gait problem.  Skin:  Negative for rash.  Neurological:  Negative for light-headedness and headaches.  Psychiatric/Behavioral:  Negative for agitation and behavioral  problems.   All other systems reviewed and are negative.  Per HPI unless specifically indicated above   Allergies as of 08/05/2021       Reactions   Penicillins    Statins    Muscle problems Muscle problems   Sulfa Antibiotics    Sulfonamide Derivatives         Medication List        Accurate as of August 05, 2021 11:14 AM. If you have any questions, ask your nurse or doctor.          atenolol 50 MG tablet Commonly known as: TENORMIN Take 2 tablets (100 mg total) by mouth daily.   eszopiclone 2 MG Tabs tablet Commonly known as: Lunesta Take 1 tablet (2 mg total) by mouth at bedtime as needed for sleep. Take immediately before bedtime   ezetimibe 10 MG tablet Commonly known as: Zetia Take 1 tablet (10 mg total) by mouth daily.   Fish Oil 600 MG Caps Take 1,200 mg by mouth 2 (two) times daily.   GLUCOSAMINE CHONDR COMPLEX PO Take 600 mg by mouth daily.   levothyroxine 100 MCG tablet Commonly known as: Euthyrox Take 1 tablet (100 mcg total) by mouth daily before breakfast.   losartan 50 MG tablet Commonly known as: COZAAR Take 1 tablet (50 mg total) by mouth daily.   multivitamin-lutein Caps capsule Take 1 capsule by mouth daily.   OSCAL 500/200 D-3 PO Take 1 capsule by mouth. Daily at lunchtime   Turmeric 500 MG Caps Take 1 capsule by mouth daily.  Vitamin D 50 MCG (2000 UT) Caps Take 1 capsule by mouth daily.         Objective:   BP 137/78   Pulse 71   Temp 97.7 F (36.5 C) (Temporal)   Resp 20   Ht $R'5\' 4"'Il$  (1.626 m)   Wt 143 lb (64.9 kg)   SpO2 96%   BMI 24.55 kg/m   Wt Readings from Last 3 Encounters:  08/05/21 143 lb (64.9 kg)  03/15/21 141 lb 9.6 oz (64.2 kg)  02/05/21 142 lb (64.4 kg)    Physical Exam Vitals and nursing note reviewed.  Constitutional:      General: She is not in acute distress.    Appearance: She is well-developed. She is not diaphoretic.  Eyes:     Conjunctiva/sclera: Conjunctivae normal.   Cardiovascular:     Rate and Rhythm: Normal rate and regular rhythm.     Heart sounds: Normal heart sounds. No murmur heard. Pulmonary:     Effort: Pulmonary effort is normal. No respiratory distress.     Breath sounds: Normal breath sounds. No wheezing.  Musculoskeletal:        General: No tenderness. Normal range of motion.  Skin:    General: Skin is warm and dry.     Findings: No rash.  Neurological:     Mental Status: She is alert and oriented to person, place, and time.     Coordination: Coordination normal.  Psychiatric:        Behavior: Behavior normal.      Assessment & Plan:   Problem List Items Addressed This Visit       Cardiovascular and Mediastinum   Essential hypertension   Relevant Orders   CBC with Differential/Platelet   CMP14+EGFR     Endocrine   Hypothyroidism - Primary   Relevant Orders   CBC with Differential/Platelet   TSH     Other   Hyperlipidemia   Relevant Orders   Lipid panel    Continue current medication, will check blood work. Follow up plan: Return in about 6 months (around 02/05/2022), or if symptoms worsen or fail to improve, for Thyroid and hypertension recheck.  Counseling provided for all of the vaccine components Orders Placed This Encounter  Procedures   Varicella-zoster vaccine IM (Shingrix)   CBC with Differential/Platelet   CMP14+EGFR   Lipid panel   TSH    Caryl Pina, MD [facility] 08/05/2021, 11:14 AM

## 2021-08-06 LAB — CBC WITH DIFFERENTIAL/PLATELET
Basophils Absolute: 0.1 10*3/uL (ref 0.0–0.2)
Basos: 1 %
EOS (ABSOLUTE): 0.3 10*3/uL (ref 0.0–0.4)
Eos: 3 %
Hematocrit: 46.3 % (ref 34.0–46.6)
Hemoglobin: 15.3 g/dL (ref 11.1–15.9)
Immature Grans (Abs): 0 10*3/uL (ref 0.0–0.1)
Immature Granulocytes: 0 %
Lymphocytes Absolute: 2.8 10*3/uL (ref 0.7–3.1)
Lymphs: 34 %
MCH: 30.2 pg (ref 26.6–33.0)
MCHC: 33 g/dL (ref 31.5–35.7)
MCV: 92 fL (ref 79–97)
Monocytes Absolute: 0.6 10*3/uL (ref 0.1–0.9)
Monocytes: 7 %
Neutrophils Absolute: 4.5 10*3/uL (ref 1.4–7.0)
Neutrophils: 55 %
Platelets: 264 10*3/uL (ref 150–450)
RBC: 5.06 x10E6/uL (ref 3.77–5.28)
RDW: 12.4 % (ref 11.7–15.4)
WBC: 8.2 10*3/uL (ref 3.4–10.8)

## 2021-08-06 LAB — LIPID PANEL
Chol/HDL Ratio: 3.9 ratio (ref 0.0–4.4)
Cholesterol, Total: 188 mg/dL (ref 100–199)
HDL: 48 mg/dL (ref >39)
LDL Chol Calc (NIH): 103 mg/dL — ABNORMAL HIGH (ref 0–99)
Triglycerides: 218 mg/dL — ABNORMAL HIGH (ref 0–149)
VLDL Cholesterol Cal: 37 mg/dL (ref 5–40)

## 2021-08-06 LAB — CMP14+EGFR
ALT: 20 IU/L (ref 0–32)
AST: 21 IU/L (ref 0–40)
Albumin/Globulin Ratio: 1.6 (ref 1.2–2.2)
Albumin: 4.3 g/dL (ref 3.6–4.6)
Alkaline Phosphatase: 59 IU/L (ref 44–121)
BUN/Creatinine Ratio: 21 (ref 12–28)
BUN: 15 mg/dL (ref 8–27)
Bilirubin Total: 0.6 mg/dL (ref 0.0–1.2)
CO2: 26 mmol/L (ref 20–29)
Calcium: 9.9 mg/dL (ref 8.7–10.3)
Chloride: 98 mmol/L (ref 96–106)
Creatinine, Ser: 0.72 mg/dL (ref 0.57–1.00)
Globulin, Total: 2.7 g/dL (ref 1.5–4.5)
Glucose: 104 mg/dL — ABNORMAL HIGH (ref 65–99)
Potassium: 4.4 mmol/L (ref 3.5–5.2)
Sodium: 138 mmol/L (ref 134–144)
Total Protein: 7 g/dL (ref 6.0–8.5)
eGFR: 84 mL/min/{1.73_m2} (ref >59)

## 2021-08-06 LAB — TSH: TSH: 2.62 u[IU]/mL (ref 0.450–4.500)

## 2021-08-09 ENCOUNTER — Ambulatory Visit (INDEPENDENT_AMBULATORY_CARE_PROVIDER_SITE_OTHER): Payer: Medicare HMO

## 2021-08-09 VITALS — Ht 64.0 in | Wt 143.0 lb

## 2021-08-09 DIAGNOSIS — Z0000 Encounter for general adult medical examination without abnormal findings: Secondary | ICD-10-CM

## 2021-08-09 NOTE — Progress Notes (Addendum)
Subjective:   [name] is a 80 y.o. female who presents for Medicare Annual (Subsequent) preventive examination.  Virtual Visit via Telephone Note  I connected with  [name] on 08/09/21 at  4:15 PM EDT by telephone and verified that I am speaking with the correct person using two identifiers.  Location: Patient: home Provider: [facility] Persons participating in the virtual visit: patient/Nurse Health Advisor   I discussed the limitations, risks, security and privacy concerns of performing an evaluation and management service by telephone and the availability of in person appointments. The patient expressed understanding and agreed to proceed.  Interactive audio and video telecommunications were attempted between this nurse and patient, however failed, due to patient having technical difficulties OR patient did not have access to video capability.  We continued and completed visit with audio only.  Some vital signs may be absent or patient reported.   [name] Sircy E Peterson Mathey, LPN  Review of Systems     Cardiac Risk Factors include: advanced age (>53mn, >>29women);obesity (BMI >30kg/m2);dyslipidemia;hypertension     Objective:    Today's Vitals   08/09/21 1556  Weight: 143 lb (64.9 kg)   Body mass index is 24.55 kg/m.  Advanced Directives 08/09/2021 08/30/2019  Does Patient Have a Medical Advance Directive? No No  Would patient like information on creating a medical advance directive? No - Patient declined No - Patient declined    Current Medications (verified) Outpatient Encounter Medications as of 08/09/2021  Medication Sig   atenolol (TENORMIN) 50 MG tablet Take 2 tablets (100 mg total) by mouth daily.   Calcium Carbonate-Vitamin D (OSCAL 500/200 D-3 PO) Take 1 capsule by mouth. Daily at lunchtime   Cholecalciferol (VITAMIN D) 2000 units CAPS Take 1 capsule by mouth daily.   eszopiclone (LUNESTA) 2 MG TABS tablet Take 1 tablet (2 mg total) by mouth at  bedtime as needed for sleep. Take immediately before bedtime   ezetimibe (ZETIA) 10 MG tablet Take 1 tablet (10 mg total) by mouth daily.   Glucosamine-Chondroitin (GLUCOSAMINE CHONDR COMPLEX PO) Take 600 mg by mouth daily.   levothyroxine (EUTHYROX) 100 MCG tablet Take 1 tablet (100 mcg total) by mouth daily before breakfast.   losartan (COZAAR) 50 MG tablet Take 1 tablet (50 mg total) by mouth daily.   multivitamin-lutein (OCUVITE-LUTEIN) CAPS Take 1 capsule by mouth daily.   Omega-3 Fatty Acids (FISH OIL) 600 MG CAPS Take 1,200 mg by mouth 2 (two) times daily.   Turmeric 500 MG CAPS Take 1 capsule by mouth daily.   No facility-administered encounter medications on file as of 08/09/2021.    Allergies (verified) Penicillins, Statins, Sulfa antibiotics, and Sulfonamide derivatives   History: Past Medical History:  Diagnosis Date   Hyperlipidemia    Hypertension    Thyroid disease    Past Surgical History:  Procedure Laterality Date   ABDOMINAL HYSTERECTOMY     APPENDECTOMY     tosillectomy     Family History  Problem Relation Age of Onset   Cancer Mother        breast   Heart disease Mother    Parkinson's disease Father    Breast cancer Neg Hx    Social History   Socioeconomic History   Marital status: Widowed    Spouse name: BAlvester Chou  Number of children: 3   Years of education: Not on file   Highest education level: Bachelor's degree (e.g., BA, AB, BS)  Occupational History   Occupation: Retired  Tobacco  Use   Smoking status: Never   Smokeless tobacco: Never  Vaping Use   Vaping Use: Never used  Substance and Sexual Activity   Alcohol use: Yes    Alcohol/week: 1.0 standard drink    Types: 1 Glasses of wine per week    Comment: occ   Drug use: No   Sexual activity: Not Currently  Other Topics Concern   Not on file  Social History Narrative   Husband, Alvester Chou, died from [place] in 03-02-20.   Social Determinants of Health   Financial Resource Strain: Low Risk     Difficulty of Paying Living Expenses: Not hard at all  Food Insecurity: No Food Insecurity   Worried About Charity fundraiser in the Last Year: Never true   [place] in the Last Year: Never true  Transportation Needs: No Transportation Needs   Lack of Transportation (Medical): No   Lack of Transportation (Non-Medical): No  Physical Activity: Insufficiently Active   Days of Exercise per Week: 7 days   Minutes of Exercise per Session: 20 min  Stress: No Stress Concern Present   Feeling of Stress : Not at all  Social Connections: Moderately Integrated   Frequency of Communication with Friends and Family: More than three times a week   Frequency of Social Gatherings with Friends and Family: More than three times a week   Attends Religious Services: More than 4 times per year   Active Member of [employer] or Organizations: Yes   Attends Archivist Meetings: More than 4 times per year   Marital Status: Widowed    Tobacco Counseling Counseling given: Not Answered   Clinical Intake:  Pre-visit preparation completed: Yes  Pain : No/denies pain     BMI - recorded: 24.55 Nutritional Status: BMI 25 -29 Overweight Nutritional Risks: None Diabetes: No  How often do you need to have someone help you when you read instructions, pamphlets, or other written materials from your doctor or pharmacy?: 1 - Never  Diabetic? No  Interpreter Needed?: No  Information entered by :: Jalil Lorusso, LPN   Activities of Daily Living In your present state of health, do you have any difficulty performing the following activities: 08/09/2021  Hearing? N  Vision? N  Difficulty concentrating or making decisions? N  Walking or climbing stairs? N  Dressing or bathing? N  Doing errands, shopping? N  Preparing Food and eating ? N  In the past six months, have you accidently leaked urine? N  Do you have problems with loss of bowel control? N  Managing your Medications? N  Managing your  Finances? N  Housekeeping or managing your Housekeeping? N  Some recent data might be hidden    Patient Care Team: Dettinger, Fransisca Kaufmann, MD as PCP - General (Family Medicine) [facility]  Indicate any recent Medical Services you may have received from other than [facility] providers in the past year (date may be approximate).     Assessment:   This is a routine wellness examination for [place].  Hearing/Vision screen Hearing Screening - Comments:: No issues with hearing. Vision Screening - Comments:: Wears readers only. Dr. Katy Fitch. Last exam 01/26/21  Dietary issues and exercise activities discussed: Current Exercise Habits: Home exercise routine, Type of exercise: walking, Time (Minutes): 30, Frequency (Times/Week): 7, Weekly Exercise (Minutes/Week): 210, Intensity: Mild, Exercise limited by: None identified   Goals Addressed             This Visit's Progress  DIET - EAT MORE FRUITS AND VEGETABLES         Depression Screen PHQ 2/9 Scores 08/09/2021 08/05/2021 03/15/2021 02/05/2021 08/27/2020 08/07/2020 05/13/2020  PHQ - 2 Score 0 0 0 0 0 0 0  PHQ- 9 Score 0 0 - - - - -    Fall Risk Fall Risk  08/09/2021 08/05/2021 03/15/2021 02/05/2021 08/27/2020  Falls in the past year? 0 0 0 0 0  Number falls in past yr: 0 - - - -  Comment - - - - -  Injury with Fall? 0 - - - -  Risk for fall due to : No Fall Risks - - - -  Follow up Falls prevention discussed Falls evaluation completed - - -    FALL RISK PREVENTION PERTAINING TO THE HOME:  Any stairs in or around the home? Yes  If so, are there any without handrails? No  Home free of loose throw rugs in walkways, pet beds, electrical cords, etc? Yes  Adequate lighting in your home to reduce risk of falls? Yes   ASSISTIVE DEVICES UTILIZED TO PREVENT FALLS:  Life alert? No  Use of a cane, walker or w/c? No  Grab bars in the bathroom? Yes  Shower chair or bench in shower? No  Elevated toilet seat or a handicapped toilet? No    TIMED UP AND GO:  Was the test performed? No  Telephonic visit.    Cognitive Function: Normal cognitive status assessed by direct observation by this Nurse Health Advisor. No abnormalities found.       6CIT Screen 08/30/2019  What Year? 0 points  What month? 0 points  What time? 0 points  Count back from 20 0 points  Months in reverse 0 points  Repeat phrase 0 points  Total Score 0    Immunizations Immunization History  Administered Date(s) Administered   Moderna Sars-Covid-2 Vaccination 01/23/2020, 02/21/2020   PFIZER(Purple Top)SARS-COV-2 Vaccination 06/17/2021   Pneumococcal Conjugate-13 06/18/2015   Pneumococcal-Unspecified 05/12/2009   Td 05/18/2016   Tetanus 01/12/2005   Zoster Recombinat (Shingrix) 05/05/2021, 08/05/2021   Zoster, Live 05/25/2010    TDAP status: Up to date  Flu Vaccine status: Up to date  Pneumococcal vaccine status: Up to date  Covid-19 vaccine status: Completed vaccines  Qualifies for Shingles Vaccine? Yes   Zostavax completed Yes   Shingrix Completed?: Yes  Screening Tests Health Maintenance  Topic Date Due   INFLUENZA VACCINE  03/11/2022 (Originally 07/12/2021)   COVID-19 Vaccine (4 - Booster) 09/17/2021   MAMMOGRAM  10/12/2021   TETANUS/TDAP  05/18/2026   DEXA SCAN  Completed   PNA vac Low Risk Adult  Completed   Zoster Vaccines- Shingrix  Completed   HPV VACCINES  Aged Out    Health Maintenance  There are no preventive care reminders to display for this patient.  Colorectal cancer screening: No longer required.   Mammogram status: Completed 10/12/21. Repeat every year  Bone Density status: Completed 10/12/2020. Results reflect: Bone density results: NORMAL. Repeat every 2 years.  Lung Cancer Screening: (Low Dose CT Chest recommended if Age 60-80 years, 30 pack-year currently smoking OR have quit w/in 15years.) does not qualify.   Hepatitis C Screening: does not qualify;  Vision Screening: Recommended annual  ophthalmology exams for early detection of glaucoma and other disorders of the eye. Is the patient up to date with their annual eye exam?  Yes  Who is the provider or what is the name of the office in which the patient  attends annual eye exams? Dr Katy Fitch If pt is not established with a provider, would they like to be referred to a provider to establish care?  N/A .   Dental Screening: Recommended annual dental exams for proper oral hygiene  Community Resource Referral / Chronic Care Management: CRR required this visit?  No   CCM required this visit?  No      Plan:     I have personally reviewed and noted the following in the patient's chart:   Medical and social history Use of alcohol, tobacco or illicit drugs  Current medications and supplements including opioid prescriptions.  Functional ability and status Nutritional status Physical activity Advanced directives List of other physicians Hospitalizations, surgeries, and ER visits in previous 12 months Vitals Screenings to include cognitive, depression, and falls Referrals and appointments  In addition, I have reviewed and discussed with patient certain preventive protocols, quality metrics, and best practice recommendations. A written personalized care plan for preventive services as well as general preventive health recommendations were provided to patient.     Sandrea Hammond, LPN   [id number]   Nurse Notes: none

## 2021-08-09 NOTE — Patient Instructions (Signed)
[name] , Thank you for taking time to come for your Medicare Wellness Visit. I appreciate your ongoing commitment to your health goals. Please review the following plan we discussed and let me know if I can assist you in the future.   Screening recommendations/referrals: Colonoscopy: No longer required Mammogram: Up to date. 10/12/2020, repeat 1 year Bone Density: Up to date 10/12/2020, repeat 2 years Recommended yearly ophthalmology/optometry visit for glaucoma screening and checkup Recommended yearly dental visit for hygiene and checkup  Vaccinations: Influenza vaccine: Due fall of 2022 Pneumococcal vaccine: Up to date Tdap vaccine: Up to date Shingles vaccine: Up to date   Covid-19:Up to date  Advanced directives: Advance directive discussed with you today. Even though you declined this today, please call our office should you change your mind, and we can give you the proper paperwork for you to fill out.   Conditions/risks identified: Aim for 30 minutes of exercise or brisk walking each day, drink 6-8 glasses of water and eat lots of fruits and vegetables.   Next appointment: Follow up in one year for your annual wellness visit    Preventive Care 65 Years and Older, Female Preventive care refers to lifestyle choices and visits with your health care provider that can promote health and wellness. What does preventive care include? A yearly physical exam. This is also called an annual well check. Dental exams once or twice a year. Routine eye exams. Ask your health care provider how often you should have your eyes checked. Personal lifestyle choices, including: Daily care of your teeth and gums. Regular physical activity. Eating a healthy diet. Avoiding tobacco and drug use. Limiting alcohol use. Practicing safe sex. Taking low-dose aspirin every day. Taking vitamin and mineral supplements as recommended by your health care provider. What happens during an annual well  check? The services and screenings done by your health care provider during your annual well check will depend on your age, overall health, lifestyle risk factors, and family history of disease. Counseling  Your health care provider may ask you questions about your: Alcohol use. Tobacco use. Drug use. Emotional well-being. Home and relationship well-being. Sexual activity. Eating habits. History of falls. Memory and ability to understand (cognition). Work and work Statistician. Reproductive health. Screening  You may have the following tests or measurements: Height, weight, and BMI. Blood pressure. Lipid and cholesterol levels. These may be checked every 5 years, or more frequently if you are over 28 years old. Skin check. Lung cancer screening. You may have this screening every year starting at age 64 if you have a 30-pack-year history of smoking and currently smoke or have quit within the past 15 years. Fecal occult blood test (FOBT) of the stool. You may have this test every year starting at age 43. Flexible sigmoidoscopy or colonoscopy. You may have a sigmoidoscopy every 5 years or a colonoscopy every 10 years starting at age 17. Hepatitis C blood test. Hepatitis B blood test. Sexually transmitted disease (STD) testing. Diabetes screening. This is done by checking your blood sugar (glucose) after you have not eaten for a while (fasting). You may have this done every 1-3 years. Bone density scan. This is done to screen for osteoporosis. You may have this done starting at age 34. Mammogram. This may be done every 1-2 years. Talk to your health care provider about how often you should have regular mammograms. Talk with your health care provider about your test results, treatment options, and if necessary, the need for  more tests. Vaccines  Your health care provider may recommend certain vaccines, such as: Influenza vaccine. This is recommended every year. Tetanus, diphtheria, and  acellular pertussis (Tdap, Td) vaccine. You may need a Td booster every 10 years. Zoster vaccine. You may need this after age 36. Pneumococcal 13-valent conjugate (PCV13) vaccine. One dose is recommended after age 25. Pneumococcal polysaccharide (PPSV23) vaccine. One dose is recommended after age 62. Talk to your health care provider about which screenings and vaccines you need and how often you need them. This information is not intended to replace advice given to you by your health care provider. Make sure you discuss any questions you have with your health care provider. Document Released: 12/25/2015 Document Revised: 08/17/2016 Document Reviewed: 09/29/2015 Elsevier Interactive Patient Education  2017 [place] Prevention in the Home Falls can cause injuries. They can happen to people of all ages. There are many things you can do to make your home safe and to help prevent falls. What can I do on the outside of my home? Regularly fix the edges of walkways and driveways and fix any cracks. Remove anything that might make you trip as you walk through a door, such as a raised step or threshold. Trim any bushes or trees on the path to your home. Use bright outdoor lighting. Clear any walking paths of anything that might make someone trip, such as rocks or tools. Regularly check to see if handrails are loose or broken. Make sure that both sides of any steps have handrails. Any raised decks and porches should have guardrails on the edges. Have any leaves, snow, or ice cleared regularly. Use sand or salt on walking paths during winter. Clean up any spills in your garage right away. This includes oil or grease spills. What can I do in the bathroom? Use night lights. Install grab bars by the toilet and in the tub and shower. Do not use towel bars as grab bars. Use non-skid mats or decals in the tub or shower. If you need to sit down in the shower, use a plastic, non-slip stool. Keep  the floor dry. Clean up any water that spills on the floor as soon as it happens. Remove soap buildup in the tub or shower regularly. Attach bath mats securely with double-sided non-slip rug tape. Do not have throw rugs and other things on the floor that can make you trip. What can I do in the bedroom? Use night lights. Make sure that you have a light by your bed that is easy to reach. Do not use any sheets or blankets that are too big for your bed. They should not hang down onto the floor. Have a firm chair that has side arms. You can use this for support while you get dressed. Do not have throw rugs and other things on the floor that can make you trip. What can I do in the kitchen? Clean up any spills right away. Avoid walking on wet floors. Keep items that you use a lot in easy-to-reach places. If you need to reach something above you, use a strong step stool that has a grab bar. Keep electrical cords out of the way. Do not use floor polish or wax that makes floors slippery. If you must use wax, use non-skid floor wax. Do not have throw rugs and other things on the floor that can make you trip. What can I do with my stairs? Do not leave any items on the stairs. Make  sure that there are handrails on both sides of the stairs and use them. Fix handrails that are broken or loose. Make sure that handrails are as long as the stairways. Check any carpeting to make sure that it is firmly attached to the stairs. Fix any carpet that is loose or worn. Avoid having throw rugs at the top or bottom of the stairs. If you do have throw rugs, attach them to the floor with carpet tape. Make sure that you have a light switch at the top of the stairs and the bottom of the stairs. If you do not have them, ask someone to add them for you. What else can I do to help prevent falls? Wear shoes that: Do not have high heels. Have rubber bottoms. Are comfortable and fit you well. Are closed at the toe. Do not  wear sandals. If you use a stepladder: Make sure that it is fully opened. Do not climb a closed stepladder. Make sure that both sides of the stepladder are locked into place. Ask someone to hold it for you, if possible. Clearly mark and make sure that you can see: Any grab bars or handrails. First and last steps. Where the edge of each step is. Use tools that help you move around (mobility aids) if they are needed. These include: Canes. Walkers. Scooters. Crutches. Turn on the lights when you go into a dark area. Replace any light bulbs as soon as they burn out. Set up your furniture so you have a clear path. Avoid moving your furniture around. If any of your floors are uneven, fix them. If there are any pets around you, be aware of where they are. Review your medicines with your doctor. Some medicines can make you feel dizzy. This can increase your chance of falling. Ask your doctor what other things that you can do to help prevent falls. This information is not intended to replace advice given to you by your health care provider. Make sure you discuss any questions you have with your health care provider. Document Released: 09/24/2009 Document Revised: 05/05/2016 Document Reviewed: 01/02/2015 Elsevier Interactive Patient Education  2017 [employer].

## 2021-08-10 ENCOUNTER — Telehealth: Payer: Self-pay | Admitting: Family Medicine

## 2021-08-10 NOTE — Telephone Encounter (Signed)
Patient aware she is up to date she might need prevnar 20 which we have not started giving yet but we would go over it at her next visit with Dr. Warrick Parisian.

## 2021-08-12 ENCOUNTER — Other Ambulatory Visit: Payer: Self-pay | Admitting: Family Medicine

## 2021-08-12 NOTE — Telephone Encounter (Signed)
Pt was just seen 08/05/21 but wasn't given refills.

## 2021-09-13 DIAGNOSIS — H00021 Hordeolum internum right upper eyelid: Secondary | ICD-10-CM | POA: Diagnosis not present

## 2021-09-15 ENCOUNTER — Other Ambulatory Visit: Payer: Self-pay | Admitting: Family Medicine

## 2021-09-15 DIAGNOSIS — Z1231 Encounter for screening mammogram for malignant neoplasm of breast: Secondary | ICD-10-CM

## 2021-09-20 DIAGNOSIS — Z961 Presence of intraocular lens: Secondary | ICD-10-CM | POA: Diagnosis not present

## 2021-09-20 DIAGNOSIS — H2511 Age-related nuclear cataract, right eye: Secondary | ICD-10-CM | POA: Diagnosis not present

## 2021-09-20 DIAGNOSIS — H35372 Puckering of macula, left eye: Secondary | ICD-10-CM | POA: Diagnosis not present

## 2021-09-20 DIAGNOSIS — H0011 Chalazion right upper eyelid: Secondary | ICD-10-CM | POA: Diagnosis not present

## 2021-09-20 DIAGNOSIS — H04123 Dry eye syndrome of bilateral lacrimal glands: Secondary | ICD-10-CM | POA: Diagnosis not present

## 2021-10-14 ENCOUNTER — Ambulatory Visit
Admission: RE | Admit: 2021-10-14 | Discharge: 2021-10-14 | Disposition: A | Discharge status: Home / Self Care | Payer: Medicare HMO | Source: Ambulatory Visit | Attending: Family Medicine | Admitting: Family Medicine

## 2021-10-14 DIAGNOSIS — Z1231 Encounter for screening mammogram for malignant neoplasm of breast: Secondary | ICD-10-CM | POA: Diagnosis not present

## 2021-10-23 ENCOUNTER — Other Ambulatory Visit: Payer: Self-pay | Admitting: Family Medicine

## 2021-10-23 DIAGNOSIS — I10 Essential (primary) hypertension: Secondary | ICD-10-CM

## 2021-11-02 DIAGNOSIS — L820 Inflamed seborrheic keratosis: Secondary | ICD-10-CM | POA: Diagnosis not present

## 2021-11-02 DIAGNOSIS — L821 Other seborrheic keratosis: Secondary | ICD-10-CM | POA: Diagnosis not present

## 2021-11-17 ENCOUNTER — Ambulatory Visit (INDEPENDENT_AMBULATORY_CARE_PROVIDER_SITE_OTHER): Payer: Medicare HMO

## 2021-11-17 DIAGNOSIS — Z23 Encounter for immunization: Secondary | ICD-10-CM

## 2022-01-17 ENCOUNTER — Other Ambulatory Visit: Payer: Self-pay | Admitting: Family Medicine

## 2022-01-17 DIAGNOSIS — E039 Hypothyroidism, unspecified: Secondary | ICD-10-CM

## 2022-01-17 DIAGNOSIS — I10 Essential (primary) hypertension: Secondary | ICD-10-CM

## 2022-01-18 DIAGNOSIS — L570 Actinic keratosis: Secondary | ICD-10-CM | POA: Diagnosis not present

## 2022-01-26 DIAGNOSIS — H35372 Puckering of macula, left eye: Secondary | ICD-10-CM | POA: Diagnosis not present

## 2022-01-26 DIAGNOSIS — H04123 Dry eye syndrome of bilateral lacrimal glands: Secondary | ICD-10-CM | POA: Diagnosis not present

## 2022-01-26 DIAGNOSIS — H2511 Age-related nuclear cataract, right eye: Secondary | ICD-10-CM | POA: Diagnosis not present

## 2022-01-26 DIAGNOSIS — Z961 Presence of intraocular lens: Secondary | ICD-10-CM | POA: Diagnosis not present

## 2022-02-07 ENCOUNTER — Other Ambulatory Visit: Payer: Self-pay | Admitting: Family Medicine

## 2022-02-07 ENCOUNTER — Encounter: Payer: Self-pay | Admitting: Family Medicine

## 2022-02-07 ENCOUNTER — Ambulatory Visit (INDEPENDENT_AMBULATORY_CARE_PROVIDER_SITE_OTHER): Payer: Medicare HMO | Admitting: Family Medicine

## 2022-02-07 VITALS — BP 150/75 | HR 71 | Ht 64.0 in | Wt 143.0 lb

## 2022-02-07 DIAGNOSIS — E782 Mixed hyperlipidemia: Secondary | ICD-10-CM

## 2022-02-07 DIAGNOSIS — F5104 Psychophysiologic insomnia: Secondary | ICD-10-CM | POA: Diagnosis not present

## 2022-02-07 DIAGNOSIS — Z23 Encounter for immunization: Secondary | ICD-10-CM | POA: Diagnosis not present

## 2022-02-07 DIAGNOSIS — E039 Hypothyroidism, unspecified: Secondary | ICD-10-CM

## 2022-02-07 DIAGNOSIS — R69 Illness, unspecified: Secondary | ICD-10-CM | POA: Diagnosis not present

## 2022-02-07 DIAGNOSIS — I10 Essential (primary) hypertension: Secondary | ICD-10-CM | POA: Diagnosis not present

## 2022-02-07 MED ORDER — ESZOPICLONE 2 MG PO TABS: ORAL_TABLET | ORAL | 3 refills | Status: DC

## 2022-02-07 MED ORDER — LOSARTAN POTASSIUM 50 MG PO TABS: 50.0000 mg | ORAL_TABLET | Freq: Every day | ORAL | 3 refills | Status: DC

## 2022-02-07 MED ORDER — ATENOLOL 50 MG PO TABS: 100.0000 mg | ORAL_TABLET | Freq: Every day | ORAL | 3 refills | Status: DC

## 2022-02-07 MED ORDER — EZETIMIBE 10 MG PO TABS: 10.0000 mg | ORAL_TABLET | Freq: Every day | ORAL | 3 refills | Status: DC

## 2022-02-07 NOTE — Telephone Encounter (Signed)
Okay let her know that the [name] was sent, I just got a rejection for coverage so it should already be at the pharmacy and have them cancel the [place] and keep the [facility] and she can see how much it will cost.  I agree that I would prefer her to stay on the [place] as well.

## 2022-02-07 NOTE — Telephone Encounter (Signed)
Pt called stating that she wants Dr Dettinger to send in Rx for [facility] because she knows that she can take that and that it works for her.  Explained to pt that the reason he did not send that Rx in is because insurance does not cover, so a similar Rx was sent in.  Pt says she doesn't care if Johnnye Sima is covered by insurance or not. Says she will use Good Rx coupon and pay whatever the cost is after that.

## 2022-02-07 NOTE — Telephone Encounter (Signed)
Apparently [name] was not covered by her insurance, I have sent a replacement that similar called [facility] that they said would be covered

## 2022-02-07 NOTE — Progress Notes (Signed)
BP (!) 150/75    Pulse 71    Ht _0  (1.626 m)    Wt 143 lb (64.9 kg)    SpO2 99%    BMI 24.55 kg/m    Subjective:   Patient ID: [name], female    DOB: [birth year], 81 y.o.   MRN: [id number]  HPI: [name] is a 81 y.o. female presenting on 02/07/2022 for Medical Management of Chronic Issues, Hypothyroidism, Hyperlipidemia, and Hypertension   HPI Hypothyroidism recheck Patient is coming in for thyroid recheck today as well. They deny any issues with hair changes or heat or cold problems or diarrhea or constipation. They deny any chest pain or palpitations. They are currently on levothyroxine 100 micrograms   Insomnia Current rx-Lunesta 2 mg nightly as needed # meds rx-30 Effectiveness of current meds-works well, does not use every night Adverse reactions form meds-none  Pill count performed-No Last drug screen -02/12/2021 ( high risk q32m moderate risk q637mlow risk yearly ) Urine drug screen today- No Was the [facility]-yes  If yes were their any concerning findings? -None  No flowsheet data found.   Controlled substance contract signed on: 02/13/2019  Hypertension Patient is currently on atenolol and losartan, and their blood pressure today is 150/75. Patient denies any lightheadedness or dizziness. Patient denies headaches, blurred vision, chest pains, shortness of breath, or weakness. Denies any side effects from medication and is content with current medication.   Hyperlipidemia Patient is coming in for recheck of his hyperlipidemia. The patient is currently taking fish oil and Zetia. They deny any issues with myalgias or history of liver damage from it. They deny any focal numbness or weakness or chest pain.   Relevant past medical, surgical, family and social history reviewed and updated as indicated. Interim medical history since our last visit reviewed. Allergies and medications reviewed and updated.  Review of Systems  Constitutional:   Negative for chills and fever.  Eyes:  Negative for visual disturbance.  Respiratory:  Negative for chest tightness and shortness of breath.   Cardiovascular:  Negative for chest pain and leg swelling.  Musculoskeletal:  Negative for back pain and gait problem.  Skin:  Negative for rash.  Neurological:  Negative for dizziness, light-headedness and headaches.  Psychiatric/Behavioral:  Negative for agitation and behavioral problems.   All other systems reviewed and are negative.  Per HPI unless specifically indicated above   Allergies as of 02/07/2022       Reactions   Penicillins    Statins    Muscle problems Muscle problems   Sulfa Antibiotics    Sulfonamide Derivatives         Medication List        Accurate as of February 07, 2022  9:00 AM. If you have any questions, ask your nurse or doctor.          atenolol 50 MG tablet Commonly known as: TENORMIN Take 2 tablets (100 mg total) by mouth daily.   eszopiclone 2 MG Tabs tablet Commonly known as: LUNESTA TAKE 1 TABLET BY MOUTH AT BEDTIME AS NEEDED FOR SLEEP TAKE  IMMEDIATELY  BEFORE  BEDTIME   ezetimibe 10 MG tablet Commonly known as: Zetia Take 1 tablet (10 mg total) by mouth daily.   Fish Oil 600 MG Caps Take 1,200 mg by mouth 2 (two) times daily.   GLUCOSAMINE CHONDR COMPLEX PO Take 600 mg by mouth daily.   levothyroxine 100 MCG tablet Commonly known as: SYNTHROID TAKE  1 TABLET BY MOUTH ONCE DAILY BEFORE BREAKFAST   losartan 50 MG tablet Commonly known as: COZAAR Take 1 tablet (50 mg total) by mouth daily.   multivitamin-lutein Caps capsule Take 1 capsule by mouth daily.   OSCAL 500/200 D-3 PO Take 1 capsule by mouth. Daily at lunchtime   Turmeric 500 MG Caps Take 1 capsule by mouth daily.   Vitamin D 50 MCG (2000 UT) Caps Take 1 capsule by mouth daily.         Objective:   BP (!) 150/75    Pulse 71    Ht _0  (1.626 m)    Wt 143 lb (64.9 kg)    SpO2 99%    BMI 24.55 kg/m   Wt  Readings from Last 3 Encounters:  02/07/22 143 lb (64.9 kg)  08/09/21 143 lb (64.9 kg)  08/05/21 143 lb (64.9 kg)    Physical Exam Vitals and nursing note reviewed.  Constitutional:      General: She is not in acute distress.    Appearance: She is well-developed. She is not diaphoretic.  Eyes:     Conjunctiva/sclera: Conjunctivae normal.  Cardiovascular:     Rate and Rhythm: Normal rate and regular rhythm.     Heart sounds: Normal heart sounds. No murmur heard. Pulmonary:     Effort: Pulmonary effort is normal. No respiratory distress.     Breath sounds: Normal breath sounds. No wheezing.  Musculoskeletal:        General: No tenderness. Normal range of motion.  Skin:    General: Skin is warm and dry.     Findings: No rash.  Neurological:     Mental Status: She is alert and oriented to person, place, and time.     Coordination: Coordination normal.  Psychiatric:        Behavior: Behavior normal.      Assessment & Plan:   Problem List Items Addressed This Visit       Cardiovascular and Mediastinum   Essential hypertension   Relevant Medications   atenolol (TENORMIN) 50 MG tablet   ezetimibe (ZETIA) 10 MG tablet   losartan (COZAAR) 50 MG tablet   Other Relevant Orders   CBC with Differential/Platelet   CMP14+EGFR     Endocrine   Hypothyroidism   Relevant Medications   atenolol (TENORMIN) 50 MG tablet   Other Relevant Orders   CBC with Differential/Platelet   TSH     Other   Hyperlipidemia   Relevant Medications   atenolol (TENORMIN) 50 MG tablet   ezetimibe (ZETIA) 10 MG tablet   losartan (COZAAR) 50 MG tablet   Other Relevant Orders   Lipid panel   Insomnia - Primary   Relevant Medications   eszopiclone (LUNESTA) 2 MG TABS tablet   Other Relevant Orders   CBC with Differential/Platelet  Continue current medicine, blood pressure runs really good at home in the 1 teens to 120s, she keeps track of it almost every day over the past few months and brought  all those numbers with her.  Follow up plan: Return in about 6 months (around 08/07/2022), or if symptoms worsen or fail to improve, for insomnia.  Counseling provided for all of the vaccine components Orders Placed This Encounter  Procedures   CBC with Differential/Platelet   CMP14+EGFR   Lipid panel   TSH    Caryl Pina, MD [facility] 02/07/2022, 9:00 AM

## 2022-02-07 NOTE — Addendum Note (Signed)
Addended by: Alphonzo Dublin on: 02/07/2022 09:57 AM   Modules accepted: Orders

## 2022-02-08 NOTE — Telephone Encounter (Signed)
Pt has been informed. She will inform the pharmacy not to fill the [place].

## 2022-02-10 ENCOUNTER — Other Ambulatory Visit: Payer: Self-pay

## 2022-02-10 ENCOUNTER — Telehealth: Payer: Self-pay | Admitting: Family Medicine

## 2022-02-10 LAB — CBC WITH DIFFERENTIAL/PLATELET
Basophils Absolute: 0.1 10*3/uL (ref 0.0–0.2)
Basos: 1 %
EOS (ABSOLUTE): 0.7 10*3/uL — ABNORMAL HIGH (ref 0.0–0.4)
Eos: 8 %
Hematocrit: 44.1 % (ref 34.0–46.6)
Hemoglobin: 14.6 g/dL (ref 11.1–15.9)
Immature Grans (Abs): 0 10*3/uL (ref 0.0–0.1)
Immature Granulocytes: 0 %
Lymphocytes Absolute: 3.2 10*3/uL — ABNORMAL HIGH (ref 0.7–3.1)
Lymphs: 39 %
MCH: 29.5 pg (ref 26.6–33.0)
MCHC: 33.1 g/dL (ref 31.5–35.7)
MCV: 89 fL (ref 79–97)
Monocytes Absolute: 0.5 10*3/uL (ref 0.1–0.9)
Monocytes: 6 %
Neutrophils Absolute: 3.7 10*3/uL (ref 1.4–7.0)
Neutrophils: 46 %
Platelets: 239 10*3/uL (ref 150–450)
RBC: 4.95 x10E6/uL (ref 3.77–5.28)
RDW: 12.3 % (ref 11.7–15.4)
WBC: 8.2 10*3/uL (ref 3.4–10.8)

## 2022-02-10 LAB — CMP14+EGFR
ALT: 19 IU/L (ref 0–32)
AST: 20 IU/L (ref 0–40)
Albumin/Globulin Ratio: 1.6 (ref 1.2–2.2)
Albumin: 4.1 g/dL (ref 3.6–4.6)
Alkaline Phosphatase: 62 IU/L (ref 44–121)
BUN/Creatinine Ratio: 22 (ref 12–28)
BUN: 14 mg/dL (ref 8–27)
Bilirubin Total: 0.4 mg/dL (ref 0.0–1.2)
CO2: 24 mmol/L (ref 20–29)
Calcium: 9 mg/dL (ref 8.7–10.3)
Chloride: 102 mmol/L (ref 96–106)
Creatinine, Ser: 0.63 mg/dL (ref 0.57–1.00)
Globulin, Total: 2.6 g/dL (ref 1.5–4.5)
Glucose: 99 mg/dL (ref 70–99)
Potassium: 4.2 mmol/L (ref 3.5–5.2)
Sodium: 139 mmol/L (ref 134–144)
Total Protein: 6.7 g/dL (ref 6.0–8.5)
eGFR: 89 mL/min/{1.73_m2} (ref >59)

## 2022-02-10 LAB — LIPID PANEL
Chol/HDL Ratio: 3.4 ratio (ref 0.0–4.4)
Cholesterol, Total: 161 mg/dL (ref 100–199)
HDL: 47 mg/dL (ref >39)
LDL Chol Calc (NIH): 94 mg/dL (ref 0–99)
Triglycerides: 112 mg/dL (ref 0–149)
VLDL Cholesterol Cal: 20 mg/dL (ref 5–40)

## 2022-02-10 LAB — TSH: TSH: 2.8 u[IU]/mL (ref 0.450–4.500)

## 2022-02-10 MED ORDER — ESZOPICLONE 2 MG PO TABS: ORAL_TABLET | ORAL | 3 refills | Status: DC

## 2022-02-10 NOTE — Telephone Encounter (Signed)
Yes that is fine, she says she is going to take the Lunesta and just buy it out of the pocket using good Rx, this was already answered through another phone message I believe?  But that is fine that she go ahead and continue on the Lunesta.  I like it better anyways ?

## 2022-02-10 NOTE — Telephone Encounter (Signed)
Patient needs lunesta sent to [facility] in [place].  ?

## 2022-02-10 NOTE — Telephone Encounter (Signed)
Lunesta printed and Rx placed on Dettinger's desk to sign. Pt made aware that it will be faxed in the morning. ?

## 2022-02-11 ENCOUNTER — Other Ambulatory Visit: Payer: Self-pay | Admitting: Family Medicine

## 2022-02-11 MED ORDER — ESZOPICLONE 2 MG PO TABS: 2.0000 mg | ORAL_TABLET | Freq: Every evening | ORAL | 3 refills | Status: DC | PRN

## 2022-02-11 NOTE — Progress Notes (Signed)
Pt informed of this last night ?

## 2022-02-11 NOTE — Progress Notes (Signed)
Sent Lunesta prescription for the patient. ?

## 2022-05-11 ENCOUNTER — Telehealth: Payer: Self-pay | Admitting: Family Medicine

## 2022-08-02 DIAGNOSIS — Z1283 Encounter for screening for malignant neoplasm of skin: Secondary | ICD-10-CM | POA: Diagnosis not present

## 2022-08-02 DIAGNOSIS — L570 Actinic keratosis: Secondary | ICD-10-CM | POA: Diagnosis not present

## 2022-08-02 DIAGNOSIS — Z85828 Personal history of other malignant neoplasm of skin: Secondary | ICD-10-CM | POA: Diagnosis not present

## 2022-08-02 DIAGNOSIS — D239 Other benign neoplasm of skin, unspecified: Secondary | ICD-10-CM | POA: Diagnosis not present

## 2022-08-08 ENCOUNTER — Ambulatory Visit (INDEPENDENT_AMBULATORY_CARE_PROVIDER_SITE_OTHER): Payer: Medicare HMO | Admitting: Family Medicine

## 2022-08-08 ENCOUNTER — Encounter: Payer: Self-pay | Admitting: Family Medicine

## 2022-08-08 VITALS — BP 136/67 | HR 69 | Temp 97.1°F | Resp 20 | Ht 64.0 in | Wt 144.0 lb

## 2022-08-08 DIAGNOSIS — E039 Hypothyroidism, unspecified: Secondary | ICD-10-CM

## 2022-08-08 DIAGNOSIS — E782 Mixed hyperlipidemia: Secondary | ICD-10-CM

## 2022-08-08 DIAGNOSIS — R69 Illness, unspecified: Secondary | ICD-10-CM | POA: Diagnosis not present

## 2022-08-08 DIAGNOSIS — Z79899 Other long term (current) drug therapy: Secondary | ICD-10-CM | POA: Diagnosis not present

## 2022-08-08 DIAGNOSIS — F5104 Psychophysiologic insomnia: Secondary | ICD-10-CM

## 2022-08-08 DIAGNOSIS — I10 Essential (primary) hypertension: Secondary | ICD-10-CM | POA: Diagnosis not present

## 2022-08-08 MED ORDER — ESZOPICLONE 2 MG PO TABS: 2.0000 mg | ORAL_TABLET | Freq: Every evening | ORAL | 3 refills | Status: DC | PRN

## 2022-08-08 NOTE — Progress Notes (Signed)
BP 136/67   Pulse 69   Temp (!) 97.1 F (36.2 C)   Resp 20   Ht $R'5\' 4"'EQ$  (1.626 m)   Wt 144 lb (65.3 kg)   SpO2 96%   BMI 24.72 kg/m    Subjective:   Patient ID: [name], female    DOB: Apr 26, 1941, 81 y.o.   MRN: [id number]  HPI: Kimmarie Pascale is a 81 y.o. female presenting on 08/08/2022 for Medical Management of Chronic Issues (6 mo )   HPI Hypertension Patient is currently on atenolol and losartan, and their blood pressure today is 136/67. Patient denies any lightheadedness or dizziness. Patient denies headaches, blurred vision, chest pains, shortness of breath, or weakness. Denies any side effects from medication and is content with current medication.   Hypothyroidism recheck Patient is coming in for thyroid recheck today as well. They deny any issues with hair changes or heat or cold problems or diarrhea or constipation. They deny any chest pain or palpitations. They are currently on levothyroxine 100 micrograms   Hyperlipidemia Patient is coming in for recheck of his hyperlipidemia. The patient is currently taking Zetia and fish oils. They deny any issues with myalgias or history of liver damage from it. They deny any focal numbness or weakness or chest pain.   Insomnia recheck Current rx-Lunesta 2 mg nightly as needed # meds rx-30/month Effectiveness of current meds-works well Adverse reactions form meds-none  Pill count performed-No Last drug screen -02/12/2021 ( high risk q29m, moderate risk q30m, low risk yearly ) Urine drug screen today- Yes Was the [place] reviewed-yes  If yes were their any concerning findings? -None, she does not use very frequently  No flowsheet data found.   Controlled substance contract signed on: Today  Relevant past medical, surgical, family and social history reviewed and updated as indicated. Interim medical history since our last visit reviewed. Allergies and medications reviewed and updated.  Review of Systems   Constitutional:  Negative for chills and fever.  Eyes:  Negative for redness and visual disturbance.  Respiratory:  Negative for chest tightness and shortness of breath.   Cardiovascular:  Negative for chest pain and leg swelling.  Musculoskeletal:  Negative for back pain and gait problem.  Skin:  Negative for rash.  Neurological:  Negative for dizziness, light-headedness and headaches.  Psychiatric/Behavioral:  Negative for agitation and behavioral problems.   All other systems reviewed and are negative.   Per HPI unless specifically indicated above   Allergies as of 08/08/2022       Reactions   Penicillins    Statins    Muscle problems Muscle problems   Sulfa Antibiotics    Sulfonamide Derivatives         Medication List        Accurate as of August 08, 2022  8:54 AM. If you have any questions, ask your nurse or doctor.          atenolol 50 MG tablet Commonly known as: TENORMIN Take 2 tablets (100 mg total) by mouth daily.   ELDERBERRY PO Take by mouth.   eszopiclone 2 MG Tabs tablet Commonly known as: LUNESTA Take 1 tablet (2 mg total) by mouth at bedtime as needed for sleep.   ezetimibe 10 MG tablet Commonly known as: Zetia Take 1 tablet (10 mg total) by mouth daily.   Fish Oil 600 MG Caps Take 1,200 mg by mouth 2 (two) times daily.   GLUCOSAMINE CHONDR COMPLEX PO Take 600 mg by  mouth daily.   levothyroxine 100 MCG tablet Commonly known as: SYNTHROID TAKE 1 TABLET BY MOUTH ONCE DAILY BEFORE BREAKFAST   losartan 50 MG tablet Commonly known as: COZAAR Take 1 tablet (50 mg total) by mouth daily.   multivitamin-lutein Caps capsule Take 1 capsule by mouth daily.   OSCAL 500/200 D-3 PO Take 1 capsule by mouth. Daily at lunchtime   Turmeric 500 MG Caps Take 1 capsule by mouth daily.   Vitamin D 50 MCG (2000 UT) Caps Take 1 capsule by mouth daily.         Objective:   BP 136/67   Pulse 69   Temp (!) 97.1 F (36.2 C)   Resp 20   Ht  $R'5\' 4"'WF$  (1.626 m)   Wt 144 lb (65.3 kg)   SpO2 96%   BMI 24.72 kg/m   Wt Readings from Last 3 Encounters:  08/08/22 144 lb (65.3 kg)  02/07/22 143 lb (64.9 kg)  08/09/21 143 lb (64.9 kg)    Physical Exam Vitals and nursing note reviewed.  Constitutional:      General: She is not in acute distress.    Appearance: She is well-developed. She is not diaphoretic.  Eyes:     Conjunctiva/sclera: Conjunctivae normal.  Cardiovascular:     Rate and Rhythm: Normal rate and regular rhythm.     Heart sounds: Normal heart sounds. No murmur heard. Pulmonary:     Effort: Pulmonary effort is normal. No respiratory distress.     Breath sounds: Normal breath sounds. No wheezing.  Musculoskeletal:        General: No swelling or tenderness. Normal range of motion.  Skin:    General: Skin is warm and dry.     Findings: No rash.  Neurological:     Mental Status: She is alert and oriented to person, place, and time.     Coordination: Coordination normal.  Psychiatric:        Behavior: Behavior normal.       Assessment & Plan:   Problem List Items Addressed This Visit       Cardiovascular and Mediastinum   Essential hypertension - Primary   Relevant Orders   CBC with Differential/Platelet   CMP14+EGFR     Endocrine   Hypothyroidism   Relevant Orders   TSH     Other   Hyperlipidemia   Relevant Orders   Lipid panel   Insomnia   Relevant Medications   eszopiclone (LUNESTA) 2 MG TABS tablet   Other Visit Diagnoses     Controlled substance agreement signed       Relevant Medications   eszopiclone (LUNESTA) 2 MG TABS tablet   Other Relevant Orders   ToxASSURE Select 13 (MW), Urine       Continue patient current medicine, no changes.  We will do blood work and urine drug screen today Follow up plan: Return in about 6 months (around 02/08/2023), or if symptoms worsen or fail to improve, for Hypertension and hyperlipidemia and hypothyroidism.  Counseling provided for all of the  vaccine components Orders Placed This Encounter  Procedures   ToxASSURE Select 13 (MW), Urine   CBC with Differential/Platelet   CMP14+EGFR   Lipid panel   TSH    Caryl Pina, MD [facility] 08/08/2022, 8:54 AM

## 2022-08-09 LAB — CMP14+EGFR
ALT: 15 IU/L (ref 0–32)
AST: 17 IU/L (ref 0–40)
Albumin/Globulin Ratio: 1.9 (ref 1.2–2.2)
Albumin: 4.3 g/dL (ref 3.7–4.7)
Alkaline Phosphatase: 67 IU/L (ref 44–121)
BUN/Creatinine Ratio: 24 (ref 12–28)
BUN: 16 mg/dL (ref 8–27)
Bilirubin Total: 0.6 mg/dL (ref 0.0–1.2)
CO2: 26 mmol/L (ref 20–29)
Calcium: 9.9 mg/dL (ref 8.7–10.3)
Chloride: 100 mmol/L (ref 96–106)
Creatinine, Ser: 0.68 mg/dL (ref 0.57–1.00)
Globulin, Total: 2.3 g/dL (ref 1.5–4.5)
Glucose: 111 mg/dL — ABNORMAL HIGH (ref 70–99)
Potassium: 4.2 mmol/L (ref 3.5–5.2)
Sodium: 140 mmol/L (ref 134–144)
Total Protein: 6.6 g/dL (ref 6.0–8.5)
eGFR: 87 mL/min/{1.73_m2} (ref >59)

## 2022-08-09 LAB — LIPID PANEL
Chol/HDL Ratio: 3.6 ratio (ref 0.0–4.4)
Cholesterol, Total: 160 mg/dL (ref 100–199)
HDL: 45 mg/dL (ref >39)
LDL Chol Calc (NIH): 90 mg/dL (ref 0–99)
Triglycerides: 144 mg/dL (ref 0–149)
VLDL Cholesterol Cal: 25 mg/dL (ref 5–40)

## 2022-08-09 LAB — CBC WITH DIFFERENTIAL/PLATELET
Basophils Absolute: 0.1 10*3/uL (ref 0.0–0.2)
Basos: 1 %
EOS (ABSOLUTE): 0.3 10*3/uL (ref 0.0–0.4)
Eos: 3 %
Hematocrit: 42.4 % (ref 34.0–46.6)
Hemoglobin: 14.4 g/dL (ref 11.1–15.9)
Immature Grans (Abs): 0 10*3/uL (ref 0.0–0.1)
Immature Granulocytes: 0 %
Lymphocytes Absolute: 4.3 10*3/uL — ABNORMAL HIGH (ref 0.7–3.1)
Lymphs: 43 %
MCH: 30.4 pg (ref 26.6–33.0)
MCHC: 34 g/dL (ref 31.5–35.7)
MCV: 90 fL (ref 79–97)
Monocytes Absolute: 0.6 10*3/uL (ref 0.1–0.9)
Monocytes: 6 %
Neutrophils Absolute: 4.7 10*3/uL (ref 1.4–7.0)
Neutrophils: 47 %
Platelets: 251 10*3/uL (ref 150–450)
RBC: 4.73 x10E6/uL (ref 3.77–5.28)
RDW: 12.3 % (ref 11.7–15.4)
WBC: 10 10*3/uL (ref 3.4–10.8)

## 2022-08-09 LAB — TSH: TSH: 2 u[IU]/mL (ref 0.450–4.500)

## 2022-08-10 ENCOUNTER — Ambulatory Visit (INDEPENDENT_AMBULATORY_CARE_PROVIDER_SITE_OTHER): Payer: Medicare HMO

## 2022-08-10 ENCOUNTER — Telehealth: Payer: Self-pay

## 2022-08-10 DIAGNOSIS — Z0000 Encounter for general adult medical examination without abnormal findings: Secondary | ICD-10-CM | POA: Diagnosis not present

## 2022-08-10 NOTE — Progress Notes (Signed)
MEDICARE ANNUAL WELLNESS VISIT  08/10/2022  Telephone Visit Disclaimer This Medicare AWV was conducted by telephone due to national recommendations for restrictions regarding the COVID-19 Pandemic (e.g. social distancing).  I verified, using two identifiers, that I am speaking with [name] or their authorized healthcare agent. I discussed the limitations, risks, security, and privacy concerns of performing an evaluation and management service by telephone and the potential availability of an in-person appointment in the future. The patient expressed understanding and agreed to proceed.  Location of Patient: Home Location of Provider (nurse):  [facility]  Subjective:    [name] is a 81 y.o. female patient of Dettinger, Fransisca Kaufmann, MD who had a Medicare Annual Wellness Visit today via telephone. [name] is Retired and lives alone. She has three children. She reports that she is socially active and does interact with friends/family regularly. She is minimally physically active and enjoys playing the piano for her church and is very active with her friends and participating in various clubs.  Patient Care Team: Dettinger, Fransisca Kaufmann, MD as PCP - General (Family Medicine) [facility] Sandford Craze, MD as Referring Physician (Dermatology)     08/10/2022    3:40 PM 08/09/2021    4:12 PM 08/30/2019    1:38 PM  Advanced Directives  Does Patient Have a Medical Advance Directive? Yes No No  Type of Advance Directive Living will    Does patient want to make changes to medical advance directive? No - Patient declined    Would patient like information on creating a medical advance directive?  No - Patient declined No - Patient declined    Hospital Utilization Over the Past 12 Months: # of hospitalizations or ER visits: 0 # of surgeries: 0  Review of Systems    Patient reports that her overall health is unchanged compared to last year.  History  obtained from chart review and the patient  Patient Reported Readings (BP, Pulse, CBG, Weight, etc) none  Pain Assessment Pain : No/denies pain     Current Medications & Allergies (verified) Allergies as of 08/10/2022       Reactions   Penicillins    Statins    Muscle problems Muscle problems   Sulfa Antibiotics    Sulfonamide Derivatives         Medication List        Accurate as of August 10, 2022  3:46 PM. If you have any questions, ask your nurse or doctor.          atenolol 50 MG tablet Commonly known as: TENORMIN Take 2 tablets (100 mg total) by mouth daily.   ELDERBERRY PO Take by mouth.   eszopiclone 2 MG Tabs tablet Commonly known as: LUNESTA Take 1 tablet (2 mg total) by mouth at bedtime as needed for sleep.   ezetimibe 10 MG tablet Commonly known as: Zetia Take 1 tablet (10 mg total) by mouth daily.   Fish Oil 600 MG Caps Take 1,200 mg by mouth 2 (two) times daily.   GLUCOSAMINE CHONDR COMPLEX PO Take 600 mg by mouth daily.   levothyroxine 100 MCG tablet Commonly known as: SYNTHROID TAKE 1 TABLET BY MOUTH ONCE DAILY BEFORE BREAKFAST   losartan 50 MG tablet Commonly known as: COZAAR Take 1 tablet (50 mg total) by mouth daily.   multivitamin-lutein Caps capsule Take 1 capsule by mouth daily.   OSCAL 500/200 D-3 PO Take 1 capsule by mouth. Daily at lunchtime  Turmeric 500 MG Caps Take 1 capsule by mouth daily.   Vitamin D 50 MCG (2000 UT) Caps Take 1 capsule by mouth daily.        History (reviewed): Past Medical History:  Diagnosis Date   Hyperlipidemia    Hypertension    Thyroid disease    Past Surgical History:  Procedure Laterality Date   ABDOMINAL HYSTERECTOMY     APPENDECTOMY     tosillectomy     Family History  Problem Relation Age of Onset   Breast cancer Mother    Cancer Mother        breast   Heart disease Mother    Parkinson's disease Father    Social History   Socioeconomic History   Marital  status: Widowed    Spouse name: Alvester Chou   Number of children: 3   Years of education: Not on file   Highest education level: Bachelor's degree (e.g., BA, AB, BS)  Occupational History   Occupation: Retired  Tobacco Use   Smoking status: Never   Smokeless tobacco: Never  Vaping Use   Vaping Use: Never used  Substance and Sexual Activity   Alcohol use: Yes    Alcohol/week: 1.0 standard drink of alcohol    Types: 1 Glasses of wine per week    Comment: occ   Drug use: No   Sexual activity: Not Currently  Other Topics Concern   Not on file  Social History Narrative   Husband, Alvester Chou, died from [place] in 2020-02-29.   Social Determinants of Health   Financial Resource Strain: Low Risk  (08/09/2021)   Overall Financial Resource Strain (CARDIA)    Difficulty of Paying Living Expenses: Not hard at all  Food Insecurity: No Food Insecurity (08/09/2021)   Hunger Vital Sign    Worried About Running Out of Food in the Last Year: Never true    Ran Out of Food in the Last Year: Never true  Transportation Needs: No Transportation Needs (08/09/2021)   PRAPARE - Hydrologist (Medical): No    Lack of Transportation (Non-Medical): No  Physical Activity: Insufficiently Active (08/09/2021)   Exercise Vital Sign    Days of Exercise per Week: 7 days    Minutes of Exercise per Session: 20 min  Stress: No Stress Concern Present (08/09/2021)   [place]    Feeling of Stress : Not at all  Social Connections: Moderately Integrated (08/09/2021)   Social Connection and Isolation Panel [NHANES]    Frequency of Communication with Friends and Family: More than three times a week    Frequency of Social Gatherings with Friends and Family: More than three times a week    Attends Religious Services: More than 4 times per year    Active Member of [employer] or Organizations: Yes    Attends Archivist Meetings: More than 4  times per year    Marital Status: Widowed    Activities of Daily Living    08/10/2022    3:40 PM  In your present state of health, do you have any difficulty performing the following activities:  Hearing? 0  Vision? 0  Difficulty concentrating or making decisions? 0  Walking or climbing stairs? 0  Dressing or bathing? 0  Doing errands, shopping? 0  Preparing Food and eating ? N  Using the Toilet? N  In the past six months, have you accidently leaked urine? N  Do you have problems  with loss of bowel control? N  Managing your Medications? N  Managing your Finances? N  Housekeeping or managing your Housekeeping? N    Patient Education/ Literacy How often do you need to have someone help you when you read instructions, pamphlets, or other written materials from your doctor or pharmacy?: 1 - Never  Exercise Current Exercise Habits: Home exercise routine, Type of exercise: calisthenics, Time (Minutes): 25, Frequency (Times/Week): 4, Weekly Exercise (Minutes/Week): 100, Intensity: Mild, Exercise limited by: respiratory conditions(s) (some shortness of breath since having Covid)  Diet Patient reports consuming 3 meals a day and 0 snack(s) a day Patient reports that her primary diet is:  gluten free. Patient reports that she does have regular access to food.   Depression Screen    08/10/2022    3:46 PM 08/08/2022    8:31 AM 02/07/2022    8:25 AM 08/09/2021    4:09 PM 08/05/2021   10:49 AM 03/15/2021   12:13 PM 02/05/2021    8:40 AM  PHQ 2/9 Scores  PHQ - 2 Score 0 0 0 0 0 0 0  PHQ- 9 Score   0 0 0       Fall Risk    08/10/2022    3:45 PM 08/08/2022    8:31 AM 02/07/2022    8:25 AM 08/09/2021    4:12 PM 08/05/2021   10:49 AM  [place] in the past year? 0 0 0 0 0  Number falls in past yr:    0   Injury with Fall?    0   Risk for fall due to :    No Fall Risks   Follow up Falls evaluation completed   Falls prevention discussed Falls evaluation completed     Objective:   Santoria Chason seemed alert and oriented and she participated appropriately during our telephone visit.  Blood Pressure Weight BMI  BP Readings from Last 3 Encounters:  08/08/22 136/67  02/07/22 (!) 150/75  08/05/21 137/78   Wt Readings from Last 3 Encounters:  08/08/22 144 lb (65.3 kg)  02/07/22 143 lb (64.9 kg)  08/09/21 143 lb (64.9 kg)   BMI Readings from Last 1 Encounters:  08/08/22 24.72 kg/m    *Unable to obtain current vital signs, weight, and BMI due to telephone visit type  Hearing/Vision  Izora Gala did not seem to have difficulty with hearing/understanding during the telephone conversation Reports that she has had a formal eye exam by an eye care professional within the past year Reports that she has not had a formal hearing evaluation within the past year *Unable to fully assess hearing and vision during telephone visit type  Cognitive Function:    08/10/2022    3:43 PM 08/30/2019    1:43 PM  6CIT Screen  What Year? 0 points 0 points  What month? 0 points 0 points  What time? 0 points 0 points  Count back from 20 0 points 0 points  Months in reverse 0 points 0 points  Repeat phrase 0 points 0 points  Total Score 0 points 0 points   (Normal:0-7, Significant for Dysfunction: >8)  Normal Cognitive Function Screening: Yes   Immunization & Health Maintenance Record Immunization History  Administered Date(s) Administered   Fluad Quad(high Dose 65+) 11/17/2021   Moderna Sars-Covid-2 Vaccination 01/23/2020, 02/21/2020   PFIZER(Purple Top)SARS-COV-2 Vaccination 06/17/2021   PNEUMOCOCCAL CONJUGATE-20 02/07/2022   Pneumococcal Conjugate-13 06/18/2015   Pneumococcal-Unspecified 05/12/2009   Td 05/18/2016   Tetanus 01/12/2005  Zoster Recombinat (Shingrix) 05/05/2021, 08/05/2021   Zoster, Live 05/25/2010    Health Maintenance  Topic Date Due   COVID-19 Vaccine (4 - Mixed Product risk series) 08/24/2022 (Originally 08/12/2021)   INFLUENZA VACCINE   03/12/2023 (Originally 07/12/2022)   MAMMOGRAM  10/14/2022   TETANUS/TDAP  05/18/2026   Pneumonia Vaccine 90+ Years old  Completed   DEXA SCAN  Completed   Zoster Vaccines- Shingrix  Completed   HPV VACCINES  Aged Out       Assessment  This is a routine wellness examination for [employer].  Health Maintenance: Due or Overdue There are no preventive care reminders to display for this patient.  [name] does not need a referral for Community Assistance: Care Management:   no Social Work:    no Prescription Assistance:  no Nutrition/Diabetes Education:  no   Plan:  Personalized Goals  Goals Addressed             This Visit's Progress    Patient Stated       08/10/2022 AWV Goal: Fall Prevention  Over the next year, patient will decrease their risk for falls by: Using assistive devices, such as a cane or walker, as needed Identifying fall risks within their home and correcting them by: Removing throw rugs Adding handrails to stairs or ramps Removing clutter and keeping a clear pathway throughout the home Increasing light, especially at night Adding shower handles/bars Raising toilet seat Identifying potential personal risk factors for falls: Medication side effects Incontinence/urgency Vestibular dysfunction Hearing loss Musculoskeletal disorders Neurological disorders Orthostatic hypotension         Personalized Health Maintenance & Screening Recommendations  Up to date  Lung Cancer Screening Recommended: no (Low Dose CT Chest recommended if Age 80-80 years, 30 pack-year currently smoking OR have quit w/in past 15 years) Hepatitis C Screening recommended: no HIV Screening recommended: no  Advanced Directives: Written information was not prepared per patient's request.  Referrals & Orders No orders of the defined types were placed in this encounter.   Follow-up Plan Follow-up with Dettinger, Fransisca Kaufmann, MD as planned    I have  personally reviewed and noted the following in the patient's chart:   Medical and social history Use of alcohol, tobacco or illicit drugs  Current medications and supplements Functional ability and status Nutritional status Physical activity Advanced directives List of other physicians Hospitalizations, surgeries, and ER visits in previous 12 months Vitals Screenings to include cognitive, depression, and falls Referrals and appointments  In addition, I have reviewed and discussed with [name] certain preventive protocols, quality metrics, and best practice recommendations. A written personalized care plan for preventive services as well as general preventive health recommendations is available and can be mailed to the patient at her request.      Burnadette Pop  08/10/2022   Patient declined after visit summary

## 2022-08-10 NOTE — Telephone Encounter (Signed)
Patient calling to get results from recent lab work.  Please review and advise.

## 2022-08-11 LAB — TOXASSURE SELECT 13 (MW), URINE

## 2022-09-10 ENCOUNTER — Other Ambulatory Visit: Payer: Self-pay | Admitting: Family Medicine

## 2022-09-10 DIAGNOSIS — E039 Hypothyroidism, unspecified: Secondary | ICD-10-CM

## 2022-09-14 ENCOUNTER — Other Ambulatory Visit: Payer: Self-pay | Admitting: Family Medicine

## 2022-09-14 DIAGNOSIS — Z1231 Encounter for screening mammogram for malignant neoplasm of breast: Secondary | ICD-10-CM

## 2022-09-30 ENCOUNTER — Telehealth: Payer: Self-pay | Admitting: Family Medicine

## 2022-09-30 NOTE — Telephone Encounter (Signed)
Has Rx for  Losartan '50mg'$  at night  Atenolol is '50mg'$  taking two tabs per day at night  Takes BP in am before exercising  Exercises about 28min am  Feels lethargic. Takes effort to walk. Pt is not dizzy, but feels like she has to be extra careful not to fall.  This am 122/72 and has been running 129/68, 113/65.  Last Sunday starting taking just half of Losartan and has been doing so since.   Pts BP at this time is  146/74- has been doing housework.  Pt will call back with number after she has been resting.

## 2022-09-30 NOTE — Telephone Encounter (Signed)
Blood pressure is good, just continue to monitor and document and bring it with you the next time you come in.

## 2022-09-30 NOTE — Telephone Encounter (Signed)
Pt will continue taking 1/2 of Losartan and (2) Atenolol nightly. Pt aware to bring readings in to next visit.

## 2022-09-30 NOTE — Telephone Encounter (Signed)
BP from 09/30/2022  124/63

## 2022-10-31 ENCOUNTER — Ambulatory Visit: Payer: Medicare HMO

## 2022-12-13 ENCOUNTER — Other Ambulatory Visit: Payer: Self-pay | Admitting: Family Medicine

## 2022-12-13 DIAGNOSIS — E039 Hypothyroidism, unspecified: Secondary | ICD-10-CM

## 2022-12-30 ENCOUNTER — Ambulatory Visit: Payer: Medicare HMO

## 2023-01-09 ENCOUNTER — Ambulatory Visit
Admission: RE | Admit: 2023-01-09 | Discharge: 2023-01-09 | Disposition: A | Discharge status: Home / Self Care | Payer: Medicare HMO | Source: Ambulatory Visit | Attending: Family Medicine | Admitting: Family Medicine

## 2023-01-09 DIAGNOSIS — Z1231 Encounter for screening mammogram for malignant neoplasm of breast: Secondary | ICD-10-CM | POA: Diagnosis not present

## 2023-02-02 DIAGNOSIS — H35372 Puckering of macula, left eye: Secondary | ICD-10-CM | POA: Diagnosis not present

## 2023-02-02 DIAGNOSIS — H04123 Dry eye syndrome of bilateral lacrimal glands: Secondary | ICD-10-CM | POA: Diagnosis not present

## 2023-02-02 DIAGNOSIS — H2511 Age-related nuclear cataract, right eye: Secondary | ICD-10-CM | POA: Diagnosis not present

## 2023-02-02 DIAGNOSIS — Z961 Presence of intraocular lens: Secondary | ICD-10-CM | POA: Diagnosis not present

## 2023-02-05 ENCOUNTER — Other Ambulatory Visit: Payer: Self-pay | Admitting: Family Medicine

## 2023-02-05 DIAGNOSIS — E782 Mixed hyperlipidemia: Secondary | ICD-10-CM

## 2023-02-08 ENCOUNTER — Other Ambulatory Visit: Payer: Self-pay | Admitting: Family Medicine

## 2023-02-08 ENCOUNTER — Ambulatory Visit (INDEPENDENT_AMBULATORY_CARE_PROVIDER_SITE_OTHER): Payer: Medicare HMO | Admitting: Family Medicine

## 2023-02-08 ENCOUNTER — Encounter: Payer: Self-pay | Admitting: Family Medicine

## 2023-02-08 VITALS — BP 170/81 | HR 70 | Ht 64.0 in | Wt 146.0 lb

## 2023-02-08 DIAGNOSIS — I10 Essential (primary) hypertension: Secondary | ICD-10-CM | POA: Diagnosis not present

## 2023-02-08 DIAGNOSIS — F5104 Psychophysiologic insomnia: Secondary | ICD-10-CM

## 2023-02-08 DIAGNOSIS — E039 Hypothyroidism, unspecified: Secondary | ICD-10-CM | POA: Diagnosis not present

## 2023-02-08 DIAGNOSIS — E782 Mixed hyperlipidemia: Secondary | ICD-10-CM

## 2023-02-08 DIAGNOSIS — Z79899 Other long term (current) drug therapy: Secondary | ICD-10-CM

## 2023-02-08 DIAGNOSIS — R69 Illness, unspecified: Secondary | ICD-10-CM | POA: Diagnosis not present

## 2023-02-08 MED ORDER — LOSARTAN POTASSIUM 50 MG PO TABS: 50.0000 mg | ORAL_TABLET | Freq: Every day | ORAL | 3 refills | Status: DC

## 2023-02-08 MED ORDER — ESZOPICLONE 2 MG PO TABS: 2.0000 mg | ORAL_TABLET | Freq: Every evening | ORAL | 3 refills | Status: DC | PRN

## 2023-02-08 MED ORDER — ATENOLOL 50 MG PO TABS: 100.0000 mg | ORAL_TABLET | Freq: Every day | ORAL | 3 refills | Status: DC

## 2023-02-08 MED ORDER — EZETIMIBE 10 MG PO TABS: 10.0000 mg | ORAL_TABLET | Freq: Every day | ORAL | 3 refills | Status: DC

## 2023-02-08 NOTE — Progress Notes (Signed)
BP (!) 170/81   Pulse 70   Ht '5\' 4"'$  (1.626 m)   Wt 66.2 kg   SpO2 98%   BMI 25.06 kg/m    Subjective:   Patient ID: [name], female    DOB: 11/17/1941, 82 y.o.   MRN: [id number]  HPI: [name] is a 82 y.o. female presenting on 02/08/2023 for Medical Management of Chronic Issues, Hyperlipidemia, Hypertension, and Hypothyroidism   HPI Hypertension Patient is currently on Losartan 50 mg PO qd and Atenolol 50 mg PO BID, and their blood pressure today is 167/79. Recheck blood pressure is 170/81. Patient has been checking blood pressure at home daily since August with majority of readings at a systolic of [id number], diastolic at [id number], and pulse in 60s. Patient states she was feeling lightheaded, "was not in control", and as if she could fall. Preceptor discussed decreasing Losartan to 25 mg and Atenolol to 50 mg PO qd 1 tablet over the phone in a prior visit in December 2023, and patient's symptoms have subsided. Patient's BP and pulse have remained stable with decrease in medication dosages, currently systolics mostly [id number] and diastolics 123456, and pulse low 60s to mid 70s. Patient denies headaches, blurred vision, chest pains, shortness of breath, or weakness. Patient is content with current doses since changes in December. Patient is having some bilateral lower extremity edema. Patient does have compression stockings.  Patient feeling much better.  Hyperlipidemia Patient is coming in for recheck of her hyperlipidemia. The patient is currently taking omega-3 fatty acids 600 mg and Ezetimibe 10 mg PO qd. They deny any issues with myalgias or history of liver damage from it. They deny any focal numbness or weakness or chest pain. Patient is content with current medications.  Hypothyroidism recheck Patient is coming in for thyroid recheck today as well. They deny any issues with hair changes, heat or cold intolerance, diarrhea or constipation. They deny any chest  pain or palpitations. They are currently on levothyroxine 100 micrograms and are content with current dose.  Insomnia Patient is coming in for insomnia recheck. Patient is currently taking Eszopliclone (Lunesta) 2 mg PO as needed for sleep. Patient states it is working well with no adverse side effects. Patient is given Rx 30 pills/month. Pill count was not performed. Last urine drug screen was 08/08/2022. No urine drug screen was performed today.  Current rx-Lunesta 2 mg nightly. # meds rx-30/month Effectiveness of current meds-works well Adverse reactions form meds-none  Pill count performed-No Last drug screen -08/12/2022 ( high risk q54m moderate risk q693mlow risk yearly ) Urine drug screen today- No Was the [place]yes  If yes were their any concerning findings? -None  No flowsheet data found.   Controlled substance contract signed on: 08/12/2022  Relevant past medical, surgical, family and social history reviewed and updated as indicated. Interim medical history since our last visit reviewed. Allergies and medications reviewed and updated.  Review of Systems  Constitutional:  Negative for chills and fever.  Eyes:  Negative for redness and visual disturbance.  Respiratory:  Negative for chest tightness and shortness of breath.   Cardiovascular:  Positive for leg swelling. Negative for chest pain and palpitations.  Gastrointestinal:  Negative for abdominal pain, constipation, diarrhea, nausea and vomiting.  Endocrine: Negative for cold intolerance and heat intolerance.  Skin:  Negative for color change, pallor, rash and wound.  Neurological:  Positive for light-headedness. Negative for dizziness, weakness, numbness and headaches.  Psychiatric/Behavioral:  Negative for sleep  disturbance.   All other systems reviewed and are negative.   Per HPI unless specifically indicated above   Allergies as of 02/08/2023       Reactions   Penicillins    Statins    Muscle  problems Muscle problems   Sulfa Antibiotics    Sulfonamide Derivatives         Medication List        Accurate as of February 08, 2023  8:51 AM. If you have any questions, ask your nurse or doctor.          atenolol 50 MG tablet Commonly known as: TENORMIN Take 2 tablets (100 mg total) by mouth daily.   ELDERBERRY PO Take by mouth.   eszopiclone 2 MG Tabs tablet Commonly known as: LUNESTA Take 1 tablet (2 mg total) by mouth at bedtime as needed for sleep.   ezetimibe 10 MG tablet Commonly known as: ZETIA Take 1 tablet (10 mg total) by mouth daily.   Fish Oil 600 MG Caps Take 1,200 mg by mouth 2 (two) times daily.   GLUCOSAMINE CHONDR COMPLEX PO Take 600 mg by mouth daily.   levothyroxine 100 MCG tablet Commonly known as: SYNTHROID TAKE 1 TABLET BY MOUTH ONCE DAILY BEFORE BREAKFAST   losartan 50 MG tablet Commonly known as: COZAAR Take 1 tablet (50 mg total) by mouth daily.   multivitamin-lutein Caps capsule Take 1 capsule by mouth daily.   OSCAL 500/200 D-3 PO Take 1 capsule by mouth. Daily at lunchtime   Turmeric 500 MG Caps Take 1 capsule by mouth daily.   Vitamin D 50 MCG (2000 UT) Caps Take 1 capsule by mouth daily.         Objective:   BP (!) 170/81   Pulse 70   Ht '5\' 4"'$  (1.626 m)   Wt 66.2 kg   SpO2 98%   BMI 25.06 kg/m   Wt Readings from Last 3 Encounters:  02/08/23 66.2 kg  08/08/22 65.3 kg  02/07/22 64.9 kg    Physical Exam Constitutional:      Appearance: Normal appearance.  Eyes:     Conjunctiva/sclera: Conjunctivae normal.  Cardiovascular:     Rate and Rhythm: Normal rate and regular rhythm.     Pulses: Normal pulses.     Heart sounds: Normal heart sounds.  Pulmonary:     Effort: Pulmonary effort is normal.     Breath sounds: No wheezing, rhonchi or rales.  Abdominal:     Palpations: Abdomen is soft.     Tenderness: There is no right CVA tenderness or left CVA tenderness.  Musculoskeletal:     Cervical back:  Neck supple. No tenderness.     Right lower leg: Edema (2+) present.     Left lower leg: Edema (2+) present.  Skin:    General: Skin is warm and dry.     Coloration: Skin is not cyanotic or pale.  Neurological:     General: No focal deficit present.     Mental Status: She is alert and oriented to person, place, and time.  Psychiatric:        Mood and Affect: Mood normal.        Behavior: Behavior normal.       Assessment & Plan:   Problem List Items Addressed This Visit       Cardiovascular and Mediastinum   Essential hypertension   Relevant Medications   atenolol (TENORMIN) 50 MG tablet   ezetimibe (ZETIA) 10 MG tablet  losartan (COZAAR) 50 MG tablet   Other Relevant Orders   CBC with Differential/Platelet   CMP14+EGFR     Endocrine   Hypothyroidism - Primary   Relevant Medications   atenolol (TENORMIN) 50 MG tablet   Other Relevant Orders   TSH     Other   Hyperlipidemia   Relevant Medications   atenolol (TENORMIN) 50 MG tablet   ezetimibe (ZETIA) 10 MG tablet   losartan (COZAAR) 50 MG tablet   Other Relevant Orders   Lipid panel   Insomnia   Relevant Medications   eszopiclone (LUNESTA) 2 MG TABS tablet   Other Visit Diagnoses     Controlled substance agreement signed       Relevant Medications   eszopiclone (LUNESTA) 2 MG TABS tablet      Patient will continue with decreased doses of Losartan 25 mg (half tablet) PO qd and Atenolol 50 mg PO qd 1 tablet. Patient will continue all other medications as normal. Patient encouraged to wear her compression stockings every day for lower extremity edema, elevate her feet when sitting, and increase physical activity by walking.  Will do CBC w/ diff, CMP, lipid panel, and TSH lab work today.  Follow up plan: Return in about 4 months (around 06/09/2023), or if symptoms worsen or fail to improve, for Thyroid insomnia recheck.  Counseling provided for all of the vaccine components Orders Placed This Encounter   Procedures   CBC with Differential/Platelet   CMP14+EGFR   Lipid panel   TSH   Hulan Amato, PA-S2 [facility] 02/08/2023, 9:30 AM  I was personally present for all components of the history, physical exam and/or medical decision making.  I agree with the documentation performed by the PA student and agree with assessment and plan above.  PA student was [employer]. Caryl Pina, MD [facility] 02/15/2023, 8:04 AM    Caryl Pina, MD [facility] 02/08/2023, 8:51 AM

## 2023-02-08 NOTE — Telephone Encounter (Signed)
Name from pharmacy: [facility] '2MG'$          TAB  Pharmacy comment: NDC not covered by insurance.  non formulary. no alternative given

## 2023-02-09 LAB — CBC WITH DIFFERENTIAL/PLATELET
Basophils Absolute: 0.1 10*3/uL (ref 0.0–0.2)
Basos: 1 %
EOS (ABSOLUTE): 0.3 10*3/uL (ref 0.0–0.4)
Eos: 2 %
Hematocrit: 45.9 % (ref 34.0–46.6)
Hemoglobin: 15.2 g/dL (ref 11.1–15.9)
Immature Grans (Abs): 0 10*3/uL (ref 0.0–0.1)
Immature Granulocytes: 0 %
Lymphocytes Absolute: 7.6 10*3/uL — ABNORMAL HIGH (ref 0.7–3.1)
Lymphs: 59 %
MCH: 30.4 pg (ref 26.6–33.0)
MCHC: 33.1 g/dL (ref 31.5–35.7)
MCV: 92 fL (ref 79–97)
Monocytes Absolute: 0.6 10*3/uL (ref 0.1–0.9)
Monocytes: 4 %
Neutrophils Absolute: 4.4 10*3/uL (ref 1.4–7.0)
Neutrophils: 34 %
Platelets: 244 10*3/uL (ref 150–450)
RBC: 5 x10E6/uL (ref 3.77–5.28)
RDW: 12.9 % (ref 11.7–15.4)
WBC: 13 10*3/uL — ABNORMAL HIGH (ref 3.4–10.8)

## 2023-02-09 LAB — LIPID PANEL
Chol/HDL Ratio: 3.4 ratio (ref 0.0–4.4)
Cholesterol, Total: 181 mg/dL (ref 100–199)
HDL: 53 mg/dL (ref >39)
LDL Chol Calc (NIH): 109 mg/dL — ABNORMAL HIGH (ref 0–99)
Triglycerides: 106 mg/dL (ref 0–149)
VLDL Cholesterol Cal: 19 mg/dL (ref 5–40)

## 2023-02-09 LAB — CMP14+EGFR
ALT: 16 IU/L (ref 0–32)
AST: 20 IU/L (ref 0–40)
Albumin/Globulin Ratio: 1.7 (ref 1.2–2.2)
Albumin: 4.3 g/dL (ref 3.7–4.7)
Alkaline Phosphatase: 71 IU/L (ref 44–121)
BUN/Creatinine Ratio: 31 — ABNORMAL HIGH (ref 12–28)
BUN: 24 mg/dL (ref 8–27)
Bilirubin Total: 0.7 mg/dL (ref 0.0–1.2)
CO2: 25 mmol/L (ref 20–29)
Calcium: 9.2 mg/dL (ref 8.7–10.3)
Chloride: 102 mmol/L (ref 96–106)
Creatinine, Ser: 0.77 mg/dL (ref 0.57–1.00)
Globulin, Total: 2.6 g/dL (ref 1.5–4.5)
Glucose: 99 mg/dL (ref 70–99)
Potassium: 4.6 mmol/L (ref 3.5–5.2)
Sodium: 139 mmol/L (ref 134–144)
Total Protein: 6.9 g/dL (ref 6.0–8.5)
eGFR: 77 mL/min/{1.73_m2} (ref >59)

## 2023-02-09 LAB — TSH: TSH: 2.49 u[IU]/mL (ref 0.450–4.500)

## 2023-02-09 NOTE — Telephone Encounter (Signed)
Please let the patient know that her insurance says that lunesta is no longer covered, sent a similar medicine called Sonata to see if covered.

## 2023-02-14 ENCOUNTER — Telehealth: Payer: Self-pay | Admitting: Family Medicine

## 2023-02-15 MED ORDER — ESZOPICLONE 2 MG PO TABS: 2.0000 mg | ORAL_TABLET | Freq: Every evening | ORAL | 5 refills | Status: DC | PRN

## 2023-02-15 NOTE — Telephone Encounter (Signed)
Patient aware.

## 2023-02-15 NOTE — Telephone Encounter (Signed)
Sent lunesta for patient again, she says she will use [employer] and self pay for the medicine rather than make the change.

## 2023-02-28 DIAGNOSIS — D485 Neoplasm of uncertain behavior of skin: Secondary | ICD-10-CM | POA: Diagnosis not present

## 2023-02-28 DIAGNOSIS — L820 Inflamed seborrheic keratosis: Secondary | ICD-10-CM | POA: Diagnosis not present

## 2023-02-28 DIAGNOSIS — L570 Actinic keratosis: Secondary | ICD-10-CM | POA: Diagnosis not present

## 2023-03-28 ENCOUNTER — Telehealth: Payer: Self-pay | Admitting: Family Medicine

## 2023-03-28 NOTE — Telephone Encounter (Signed)
Contacted [name] to schedule their annual wellness visit. Appointment made for 04/05/2023.  Thank you,  Judeth Cornfield,  AMB Clinical Support [facility] AWV Program Direct Dial ??[phone]

## 2023-04-04 DIAGNOSIS — M79676 Pain in unspecified toe(s): Secondary | ICD-10-CM | POA: Diagnosis not present

## 2023-04-04 DIAGNOSIS — B351 Tinea unguium: Secondary | ICD-10-CM | POA: Diagnosis not present

## 2023-04-05 ENCOUNTER — Ambulatory Visit (INDEPENDENT_AMBULATORY_CARE_PROVIDER_SITE_OTHER): Payer: Medicare HMO

## 2023-04-05 VITALS — Ht 64.0 in | Wt 146.0 lb

## 2023-04-05 DIAGNOSIS — Z780 Asymptomatic menopausal state: Secondary | ICD-10-CM

## 2023-04-05 DIAGNOSIS — Z0000 Encounter for general adult medical examination without abnormal findings: Secondary | ICD-10-CM

## 2023-04-05 DIAGNOSIS — Z13820 Encounter for screening for osteoporosis: Secondary | ICD-10-CM

## 2023-04-05 NOTE — Progress Notes (Signed)
Subjective:   [name] is a 82 y.o. female who presents for Medicare Annual (Subsequent) preventive examination.  I connected with  [name] on 04/05/23 by a audio enabled telemedicine application and verified that I am speaking with the correct person using two identifiers.  Patient Location: Home  Provider Location: Home Office  I discussed the limitations of evaluation and management by telemedicine. The patient expressed understanding and agreed to proceed.  Review of Systems     Cardiac Risk Factors include: advanced age (>44men, >49 women);dyslipidemia;hypertension     Objective:    Today's Vitals   04/05/23 1434  Weight: 146 lb (66.2 kg)  Height: 5\' 4"  (1.626 m)   Body mass index is 25.06 kg/m.     04/05/2023    2:37 PM 08/10/2022    3:40 PM 08/09/2021    4:12 PM 08/30/2019    1:38 PM  Advanced Directives  Does Patient Have a Medical Advance Directive? Yes Yes No No  Type of Advance Directive Out of facility DNR (pink MOST or yellow form);Living will Living will    Does patient want to make changes to medical advance directive? No - Patient declined No - Patient declined    Copy of Healthcare Power of Attorney in Chart? No - copy requested     Would patient like information on creating a medical advance directive?   No - Patient declined No - Patient declined    Current Medications (verified) Outpatient Encounter Medications as of 04/05/2023  Medication Sig   atenolol (TENORMIN) 50 MG tablet Take 2 tablets (100 mg total) by mouth daily.   Calcium Carbonate-Vitamin D (OSCAL 500/200 D-3 PO) Take 1 capsule by mouth. Daily at lunchtime   Cholecalciferol (VITAMIN D) 2000 units CAPS Take 1 capsule by mouth daily.   ELDERBERRY PO Take by mouth.   eszopiclone (LUNESTA) 2 MG TABS tablet Take 1 tablet (2 mg total) by mouth at bedtime as needed for sleep. Take immediately before bedtime   ezetimibe (ZETIA) 10 MG tablet Take 1 tablet (10 mg total)  by mouth daily.   Glucosamine-Chondroitin (GLUCOSAMINE CHONDR COMPLEX PO) Take 600 mg by mouth daily.   levothyroxine (SYNTHROID) 100 MCG tablet TAKE 1 TABLET BY MOUTH ONCE DAILY BEFORE BREAKFAST   losartan (COZAAR) 50 MG tablet Take 1 tablet (50 mg total) by mouth daily.   multivitamin-lutein (OCUVITE-LUTEIN) CAPS Take 1 capsule by mouth daily.   Omega-3 Fatty Acids (FISH OIL) 600 MG CAPS Take 1,200 mg by mouth 2 (two) times daily.   Turmeric 500 MG CAPS Take 1 capsule by mouth daily.   No facility-administered encounter medications on file as of 04/05/2023.    Allergies (verified) Penicillins, Statins, Sulfa antibiotics, and Sulfonamide derivatives   History: Past Medical History:  Diagnosis Date   Hyperlipidemia    Hypertension    Thyroid disease    Past Surgical History:  Procedure Laterality Date   ABDOMINAL HYSTERECTOMY     APPENDECTOMY     tosillectomy     Family History  Problem Relation Age of Onset   Breast cancer Mother    Cancer Mother        breast   Heart disease Mother    Parkinson's disease Father    Social History   Socioeconomic History   Marital status: Widowed    Spouse name: Gery Pray   Number of children: 3   Years of education: Not on file   Highest education level: Bachelor's degree (e.g., BA, AB,  BS)  Occupational History   Occupation: Retired  Tobacco Use   Smoking status: Never   Smokeless tobacco: Never  Vaping Use   Vaping Use: Never used  Substance and Sexual Activity   Alcohol use: Yes    Alcohol/week: 1.0 standard drink of alcohol    Types: 1 Glasses of wine per week    Comment: occ   Drug use: No   Sexual activity: Not Currently  Other Topics Concern   Not on file  Social History Narrative   Husband, Gery Pray, died from Covid in 05/08/20.   Social Determinants of Health   Financial Resource Strain: Low Risk  (04/05/2023)   Overall Financial Resource Strain (CARDIA)    Difficulty of Paying Living Expenses: Not hard at all  Food  Insecurity: No Food Insecurity (04/05/2023)   Hunger Vital Sign    Worried About Running Out of Food in the Last Year: Never true    Ran Out of Food in the Last Year: Never true  Transportation Needs: No Transportation Needs (04/05/2023)   PRAPARE - Administrator, Civil Service (Medical): No    Lack of Transportation (Non-Medical): No  Physical Activity: Sufficiently Active (04/05/2023)   Exercise Vital Sign    Days of Exercise per Week: 5 days    Minutes of Exercise per Session: 30 min  Stress: No Stress Concern Present (04/05/2023)   [employer] of Occupational Health - Occupational Stress Questionnaire    Feeling of Stress : Not at all  Social Connections: Moderately Integrated (04/05/2023)   Social Connection and Isolation Panel [NHANES]    Frequency of Communication with Friends and Family: More than three times a week    Frequency of Social Gatherings with Friends and Family: More than three times a week    Attends Religious Services: More than 4 times per year    Active Member of [employer] or Organizations: Yes    Attends Banker Meetings: More than 4 times per year    Marital Status: Widowed    Tobacco Counseling Counseling given: Not Answered   Clinical Intake:  Pre-visit preparation completed: Yes  Pain : No/denies pain  Diabetes: No  How often do you need to have someone help you when you read instructions, pamphlets, or other written materials from your doctor or pharmacy?: 1 - Never  Diabetic?No   Interpreter Needed?: No  Information entered by :: Kandis Fantasia LPN   Activities of Daily Living    04/05/2023    2:37 PM 08/10/2022    3:40 PM  In your present state of health, do you have any difficulty performing the following activities:  Hearing? 0 0  Vision? 0 0  Difficulty concentrating or making decisions? 0 0  Walking or climbing stairs? 0 0  Dressing or bathing? 0 0  Doing errands, shopping? 0 0  Preparing Food and eating  ? N N  Using the Toilet? N N  In the past six months, have you accidently leaked urine? N N  Do you have problems with loss of bowel control? N N  Managing your Medications? N N  Managing your Finances? N N  Housekeeping or managing your Housekeeping? N N    Patient Care Team: Dettinger, Elige Radon, MD as PCP - General (Family Medicine) [facility] Marcelino Duster, MD as Referring Physician (Dermatology)  Indicate any recent Medical Services you may have received from other than [facility] providers in the past year (date may be approximate).  Assessment:   This is a routine wellness examination for [place].  Hearing/Vision screen Hearing Screening - Comments:: Denies hearing difficulties   Vision Screening - Comments::  up to date with routine eye exams with Dr. Dione Booze    Dietary issues and exercise activities discussed: Current Exercise Habits: Home exercise routine, Type of exercise: walking, Time (Minutes): 30, Frequency (Times/Week): 5, Weekly Exercise (Minutes/Week): 150, Intensity: Mild   Goals Addressed             This Visit's Progress    COMPLETED: Patient Stated       08/10/2022 AWV Goal: Fall Prevention  Over the next year, patient will decrease their risk for falls by: Using assistive devices, such as a cane or walker, as needed Identifying fall risks within their home and correcting them by: Removing throw rugs Adding handrails to stairs or ramps Removing clutter and keeping a clear pathway throughout the home Increasing light, especially at night Adding shower handles/bars Raising toilet seat Identifying potential personal risk factors for falls: Medication side effects Incontinence/urgency Vestibular dysfunction Hearing loss Musculoskeletal disorders Neurological disorders Orthostatic hypotension       Remain active and independent        Depression Screen    04/05/2023    2:36 PM 02/08/2023    8:17 AM 08/10/2022    3:46 PM  08/08/2022    8:31 AM 02/07/2022    8:25 AM 08/09/2021    4:09 PM 08/05/2021   10:49 AM  PHQ 2/9 Scores  PHQ - 2 Score 0 0 0 0 0 0 0  PHQ- 9 Score 0 0   0 0 0    Fall Risk    04/05/2023    2:37 PM 02/08/2023    8:17 AM 08/10/2022    3:45 PM 08/08/2022    8:31 AM 02/07/2022    8:25 AM  Fall Risk   Falls in the past year? 0 0 0 0 0  Number falls in past yr: 0      Injury with Fall? 0      Risk for fall due to : No Fall Risks      Follow up Falls prevention discussed;Education provided;Falls evaluation completed  Falls evaluation completed      FALL RISK PREVENTION PERTAINING TO THE HOME:  Any stairs in or around the home? No  If so, are there any without handrails? No  Home free of loose throw rugs in walkways, pet beds, electrical cords, etc? Yes  Adequate lighting in your home to reduce risk of falls? Yes   ASSISTIVE DEVICES UTILIZED TO PREVENT FALLS:  Life alert? No  Use of a cane, walker or w/c? No  Grab bars in the bathroom? Yes  Shower chair or bench in shower? No  Elevated toilet seat or a handicapped toilet? Yes   TIMED UP AND GO:  Was the test performed? No . Telephonic visit   Cognitive Function:        04/05/2023    2:37 PM 08/10/2022    3:43 PM 08/30/2019    1:43 PM  6CIT Screen  What Year? 0 points 0 points 0 points  What month? 0 points 0 points 0 points  What time? 0 points 0 points 0 points  Count back from 20 0 points 0 points 0 points  Months in reverse 0 points 0 points 0 points  Repeat phrase 0 points 0 points 0 points  Total Score 0 points 0 points 0 points    Immunizations  Immunization History  Administered Date(s) Administered   Fluad Quad(high Dose 65+) 11/17/2021   Moderna Sars-Covid-2 Vaccination 01/23/2020, 02/21/2020   PFIZER(Purple Top)SARS-COV-2 Vaccination 06/17/2021   PNEUMOCOCCAL CONJUGATE-20 02/07/2022   Pneumococcal Conjugate-13 06/18/2015   Pneumococcal-Unspecified 05/12/2009   Td 05/18/2016   Tetanus 01/12/2005   Zoster  Recombinat (Shingrix) 05/05/2021, 08/05/2021   Zoster, Live 05/25/2010    TDAP status: Up to date  Flu Vaccine status: Up to date  Pneumococcal vaccine status: Up to date  Covid-19 vaccine status: Information provided on how to obtain vaccines.   Qualifies for Shingles Vaccine? Yes   Zostavax completed Yes   Shingrix Completed?: Yes  Screening Tests Health Maintenance  Topic Date Due   COVID-19 Vaccine (4 - 2023-24 season) 08/12/2022   INFLUENZA VACCINE  07/13/2023   MAMMOGRAM  01/10/2024   Medicare Annual Wellness (AWV)  04/04/2024   DTaP/Tdap/Td (2 - Tdap) 05/18/2026   Pneumonia Vaccine 33+ Years old  Completed   DEXA SCAN  Completed   Zoster Vaccines- Shingrix  Completed   HPV VACCINES  Aged Out    Health Maintenance  Health Maintenance Due  Topic Date Due   COVID-19 Vaccine (4 - 2023-24 season) 08/12/2022    Colorectal cancer screening: No longer required.   Mammogram status: Completed 01/09/23. Repeat every year  Bone Density status: Ordered today. Pt provided with contact info and advised to call to schedule appt.  Lung Cancer Screening: (Low Dose CT Chest recommended if Age 50-80 years, 30 pack-year currently smoking OR have quit w/in 15years.) does not qualify.   Lung Cancer Screening Referral: n/a  Additional Screening:  Hepatitis C Screening: does not qualify;  Vision Screening: Recommended annual ophthalmology exams for early detection of glaucoma and other disorders of the eye. Is the patient up to date with their annual eye exam?  Yes  Who is the provider or what is the name of the office in which the patient attends annual eye exams? [facility] If pt is not established with a provider, would they like to be referred to a provider to establish care? No .   Dental Screening: Recommended annual dental exams for proper oral hygiene  Community Resource Referral / Chronic Care Management: CRR required this visit?  No   CCM required this visit?   No      Plan:     I have personally reviewed and noted the following in the patient's chart:   Medical and social history Use of alcohol, tobacco or illicit drugs  Current medications and supplements including opioid prescriptions. Patient is not currently taking opioid prescriptions. Functional ability and status Nutritional status Physical activity Advanced directives List of other physicians Hospitalizations, surgeries, and ER visits in previous 12 months Vitals Screenings to include cognitive, depression, and falls Referrals and appointments  In addition, I have reviewed and discussed with patient certain preventive protocols, quality metrics, and best practice recommendations. A written personalized care plan for preventive services as well as general preventive health recommendations were provided to patient.     Durwin Nora, [place]   1/61/0960   Due to this being a virtual visit, the after visit summary with patients personalized plan was offered to patient via mail or my-chart.  per request, patient was mailed a copy of AVS.  Nurse Notes: No concerns

## 2023-04-05 NOTE — Patient Instructions (Signed)
[name] , Thank you for taking time to come for your Medicare Wellness Visit. I appreciate your ongoing commitment to your health goals. Please review the following plan we discussed and let me know if I can assist you in the future.   These are the goals we discussed:  Goals      DIET - EAT MORE FRUITS AND VEGETABLES     DIET - REDUCE SODIUM INTAKE     Pt does not snack often but when she does it is usually salty snacks. She would like to not eat salty and switch to a healthier choice.     Remain active and independent        This is a list of the screening recommended for you and due dates:  Health Maintenance  Topic Date Due   COVID-19 Vaccine (4 - 2023-24 season) 08/12/2022   Flu Shot  07/13/2023   Mammogram  01/10/2024   Medicare Annual Wellness Visit  04/04/2024   DTaP/Tdap/Td vaccine (2 - Tdap) 05/18/2026   Pneumonia Vaccine  Completed   DEXA scan (bone density measurement)  Completed   Zoster (Shingles) Vaccine  Completed   HPV Vaccine  Aged Out    Advanced directives: Please bring a copy of your health care power of attorney and living will to the office to be added to your chart at your convenience.   Conditions/risks identified: Aim for 30 minutes of exercise or brisk walking, 6-8 glasses of water, and 5 servings of fruits and vegetables each day.  Next appointment: Follow up in one year for your annual wellness visit    Preventive Care 65 Years and Older, Female Preventive care refers to lifestyle choices and visits with your health care provider that can promote health and wellness. What does preventive care include? A yearly physical exam. This is also called an annual well check. Dental exams once or twice a year. Routine eye exams. Ask your health care provider how often you should have your eyes checked. Personal lifestyle choices, including: Daily care of your teeth and gums. Regular physical activity. Eating a healthy diet. Avoiding tobacco and drug  use. Limiting alcohol use. Practicing safe sex. Taking low-dose aspirin every day. Taking vitamin and mineral supplements as recommended by your health care provider. What happens during an annual well check? The services and screenings done by your health care provider during your annual well check will depend on your age, overall health, lifestyle risk factors, and family history of disease. Counseling  Your health care provider may ask you questions about your: Alcohol use. Tobacco use. Drug use. Emotional well-being. Home and relationship well-being. Sexual activity. Eating habits. History of falls. Memory and ability to understand (cognition). Work and work Astronomer. Reproductive health. Screening  You may have the following tests or measurements: Height, weight, and BMI. Blood pressure. Lipid and cholesterol levels. These may be checked every 5 years, or more frequently if you are over 49 years old. Skin check. Lung cancer screening. You may have this screening every year starting at age 90 if you have a 30-pack-year history of smoking and currently smoke or have quit within the past 15 years. Fecal occult blood test (FOBT) of the stool. You may have this test every year starting at age 52. Flexible sigmoidoscopy or colonoscopy. You may have a sigmoidoscopy every 5 years or a colonoscopy every 10 years starting at age 63. Hepatitis C blood test. Hepatitis B blood test. Sexually transmitted disease (STD) testing. Diabetes screening. This  is done by checking your blood sugar (glucose) after you have not eaten for a while (fasting). You may have this done every 1-3 years. Bone density scan. This is done to screen for osteoporosis. You may have this done starting at age 54. Mammogram. This may be done every 1-2 years. Talk to your health care provider about how often you should have regular mammograms. Talk with your health care provider about your test results, treatment  options, and if necessary, the need for more tests. Vaccines  Your health care provider may recommend certain vaccines, such as: Influenza vaccine. This is recommended every year. Tetanus, diphtheria, and acellular pertussis (Tdap, Td) vaccine. You may need a Td booster every 10 years. Zoster vaccine. You may need this after age 42. Pneumococcal 13-valent conjugate (PCV13) vaccine. One dose is recommended after age 66. Pneumococcal polysaccharide (PPSV23) vaccine. One dose is recommended after age 47. Talk to your health care provider about which screenings and vaccines you need and how often you need them. This information is not intended to replace advice given to you by your health care provider. Make sure you discuss any questions you have with your health care provider. Document Released: 12/25/2015 Document Revised: 08/17/2016 Document Reviewed: 09/29/2015 Elsevier Interactive Patient Education  2017 [employer] Prevention in the Home Falls can cause injuries. They can happen to people of all ages. There are many things you can do to make your home safe and to help prevent falls. What can I do on the outside of my home? Regularly fix the edges of walkways and driveways and fix any cracks. Remove anything that might make you trip as you walk through a door, such as a raised step or threshold. Trim any bushes or trees on the path to your home. Use bright outdoor lighting. Clear any walking paths of anything that might make someone trip, such as rocks or tools. Regularly check to see if handrails are loose or broken. Make sure that both sides of any steps have handrails. Any raised decks and porches should have guardrails on the edges. Have any leaves, snow, or ice cleared regularly. Use sand or salt on walking paths during winter. Clean up any spills in your garage right away. This includes oil or grease spills. What can I do in the bathroom? Use night lights. Install grab  bars by the toilet and in the tub and shower. Do not use towel bars as grab bars. Use non-skid mats or decals in the tub or shower. If you need to sit down in the shower, use a plastic, non-slip stool. Keep the floor dry. Clean up any water that spills on the floor as soon as it happens. Remove soap buildup in the tub or shower regularly. Attach bath mats securely with double-sided non-slip rug tape. Do not have throw rugs and other things on the floor that can make you trip. What can I do in the bedroom? Use night lights. Make sure that you have a light by your bed that is easy to reach. Do not use any sheets or blankets that are too big for your bed. They should not hang down onto the floor. Have a firm chair that has side arms. You can use this for support while you get dressed. Do not have throw rugs and other things on the floor that can make you trip. What can I do in the kitchen? Clean up any spills right away. Avoid walking on wet floors. Keep items that  you use a lot in easy-to-reach places. If you need to reach something above you, use a strong step stool that has a grab bar. Keep electrical cords out of the way. Do not use floor polish or wax that makes floors slippery. If you must use wax, use non-skid floor wax. Do not have throw rugs and other things on the floor that can make you trip. What can I do with my stairs? Do not leave any items on the stairs. Make sure that there are handrails on both sides of the stairs and use them. Fix handrails that are broken or loose. Make sure that handrails are as long as the stairways. Check any carpeting to make sure that it is firmly attached to the stairs. Fix any carpet that is loose or worn. Avoid having throw rugs at the top or bottom of the stairs. If you do have throw rugs, attach them to the floor with carpet tape. Make sure that you have a light switch at the top of the stairs and the bottom of the stairs. If you do not have them,  ask someone to add them for you. What else can I do to help prevent falls? Wear shoes that: Do not have high heels. Have rubber bottoms. Are comfortable and fit you well. Are closed at the toe. Do not wear sandals. If you use a stepladder: Make sure that it is fully opened. Do not climb a closed stepladder. Make sure that both sides of the stepladder are locked into place. Ask someone to hold it for you, if possible. Clearly mark and make sure that you can see: Any grab bars or handrails. First and last steps. Where the edge of each step is. Use tools that help you move around (mobility aids) if they are needed. These include: Canes. Walkers. Scooters. Crutches. Turn on the lights when you go into a dark area. Replace any light bulbs as soon as they burn out. Set up your furniture so you have a clear path. Avoid moving your furniture around. If any of your floors are uneven, fix them. If there are any pets around you, be aware of where they are. Review your medicines with your doctor. Some medicines can make you feel dizzy. This can increase your chance of falling. Ask your doctor what other things that you can do to help prevent falls. This information is not intended to replace advice given to you by your health care provider. Make sure you discuss any questions you have with your health care provider. Document Released: 09/24/2009 Document Revised: 05/05/2016 Document Reviewed: 01/02/2015 Elsevier Interactive Patient Education  2017 [employer].

## 2023-04-13 ENCOUNTER — Telehealth: Payer: Self-pay | Admitting: Family Medicine

## 2023-04-14 NOTE — Telephone Encounter (Signed)
Called patient and made appt for Monday 04/17/23

## 2023-04-16 IMAGING — MG MM DIGITAL SCREENING BILAT W/ TOMO AND CAD
8 series · 9 of 24 positions shown · non-contrast
Comparison: Previous exam(s).

CLINICAL DATA: Screening.

EXAM:
DIGITAL SCREENING BILATERAL MAMMOGRAM WITH TOMOSYNTHESIS AND CAD
TECHNIQUE: Bilateral screening digital craniocaudal and mediolateral oblique
mammograms were obtained. Bilateral screening digital breast
tomosynthesis was performed. The images were evaluated with
computer-aided detection.

[R CC synth-2D]
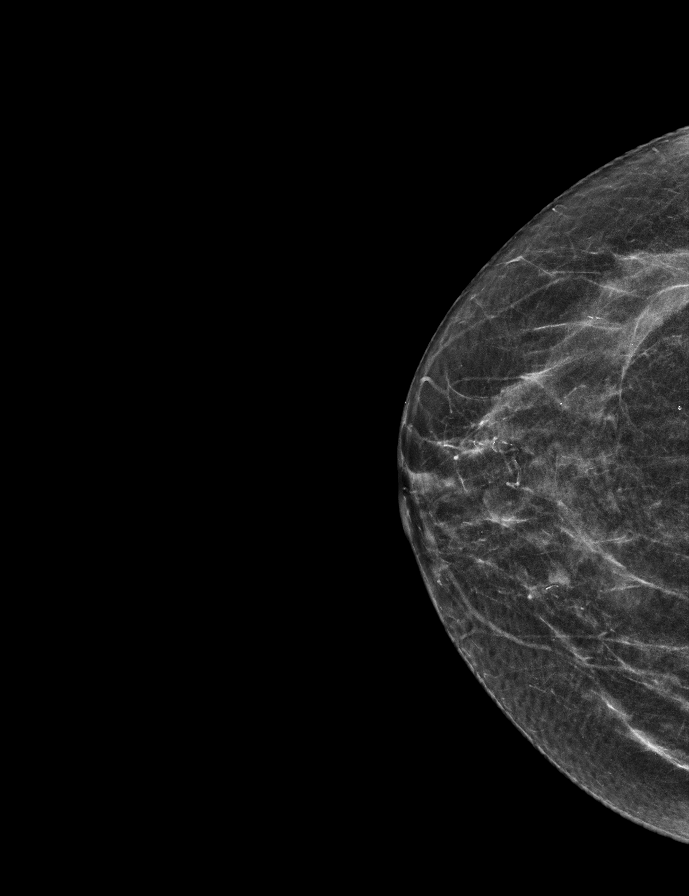

[L MLO synth-2D]
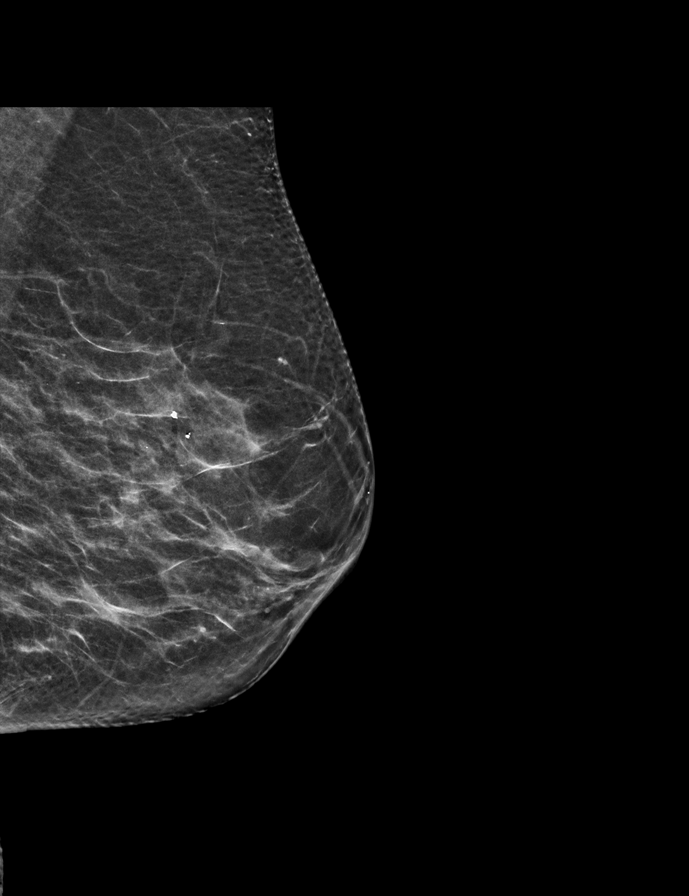

[R MLO synth-2D]
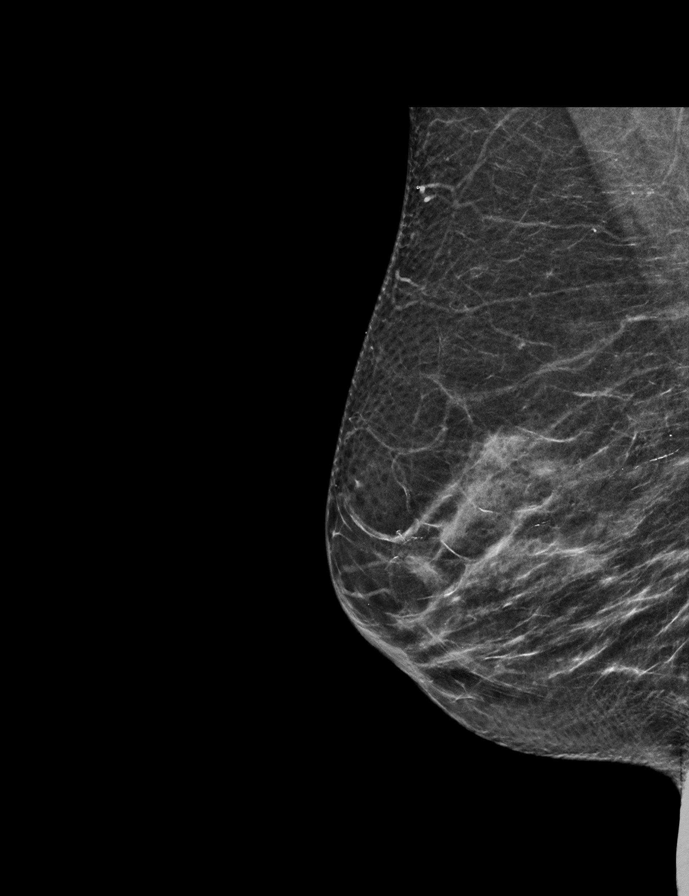

[L CC synth-2D]
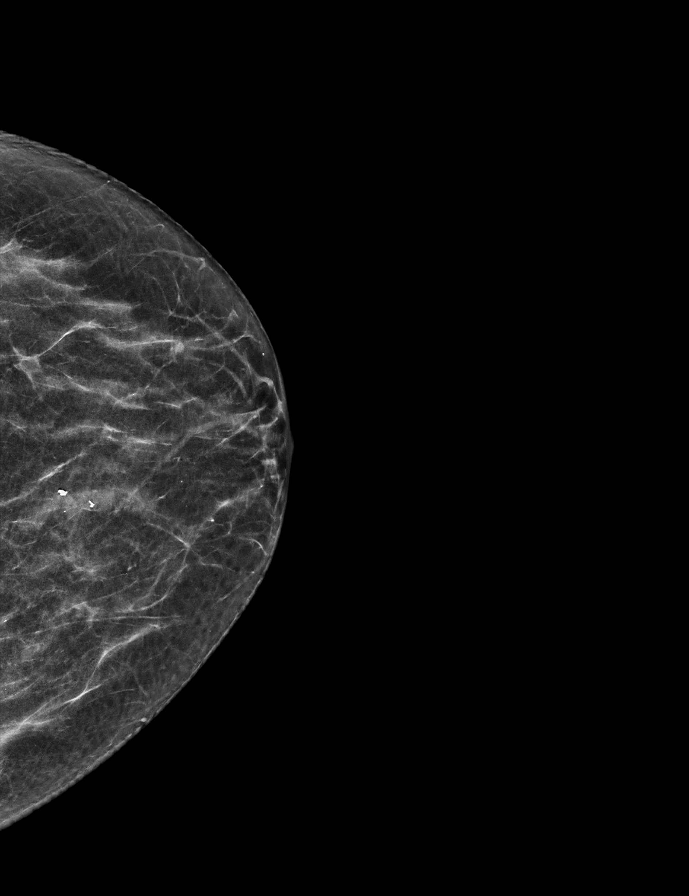

[R MLO tomo · 2 of 55 frames shown]
[frame 18/55]
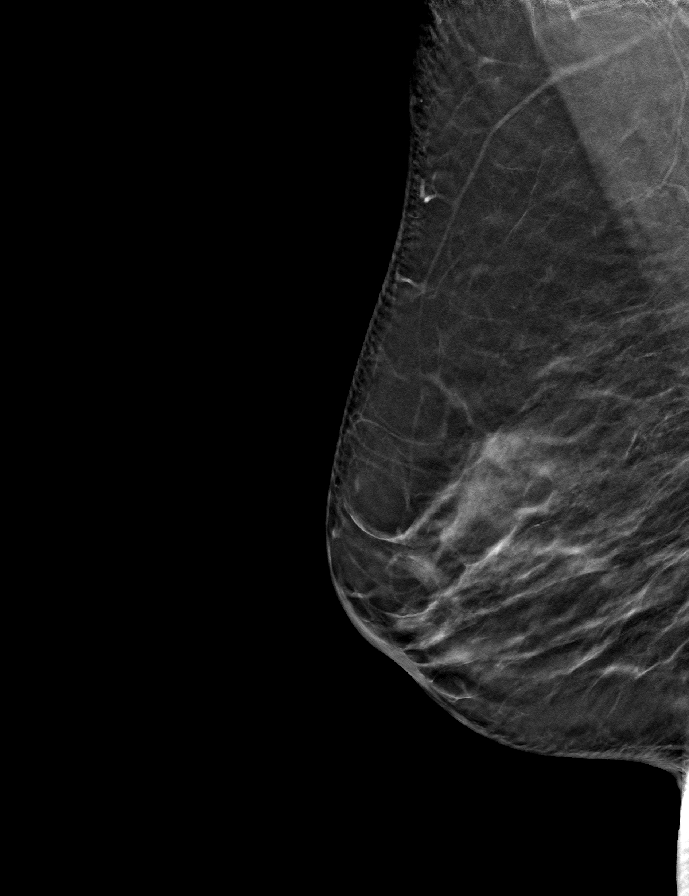
[frame 28/55]
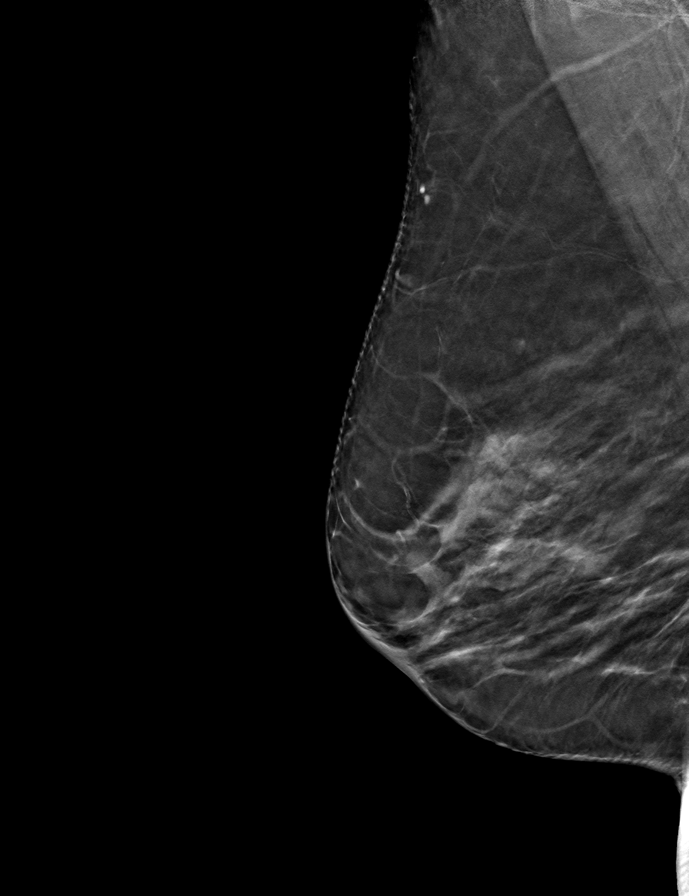

[L CC tomo · tomo slice 25/50.0]
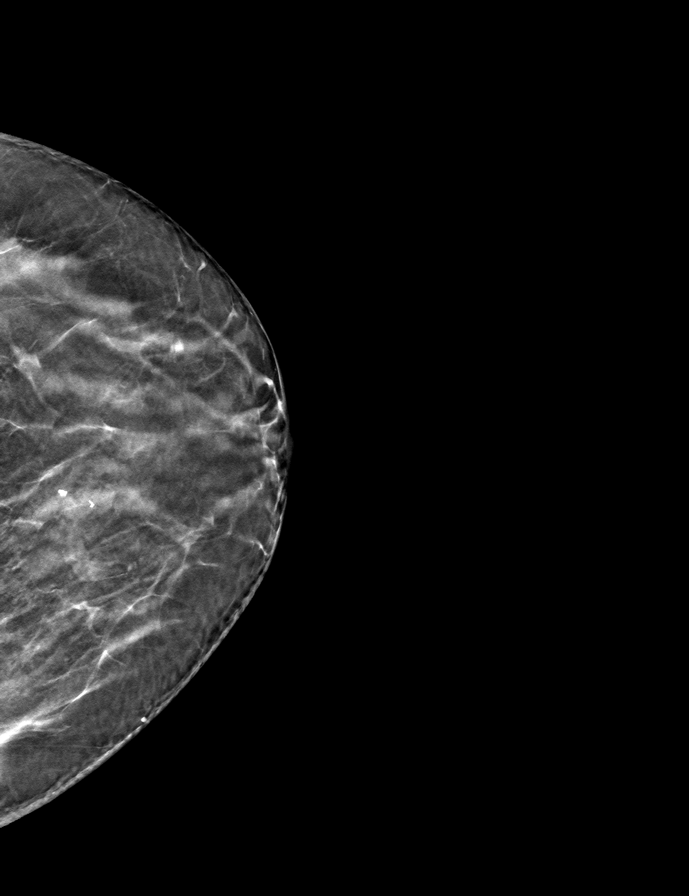

[R CC tomo · tomo slice 24/47.0]
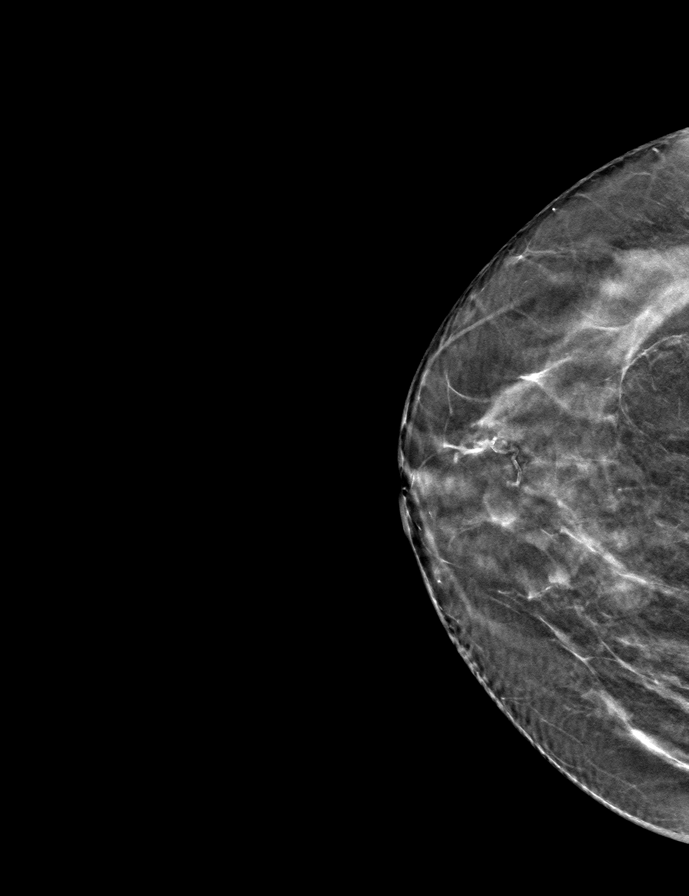

[L MLO tomo · tomo slice 27/54.0]
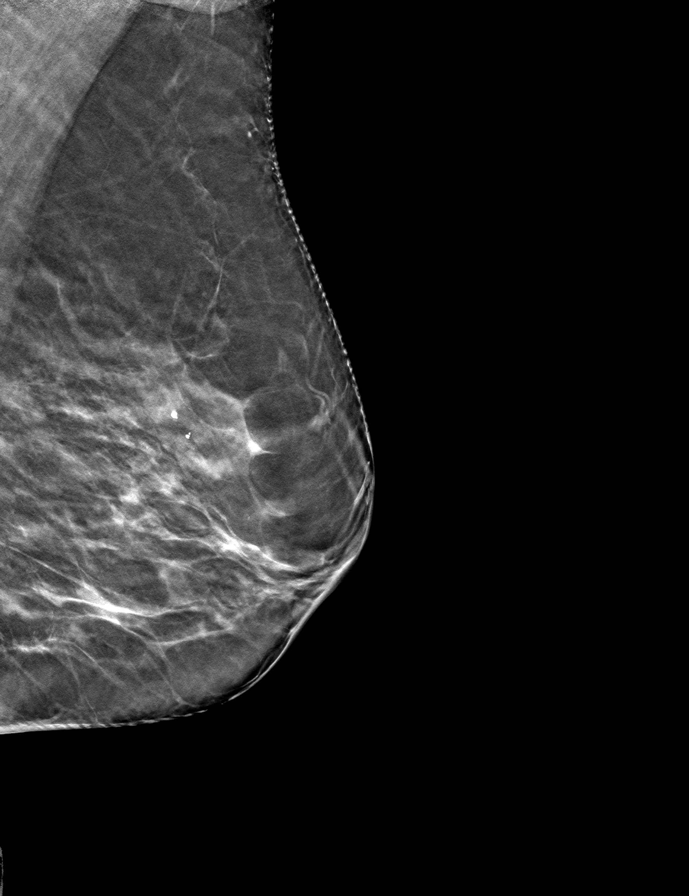

[9 of 24 positions shown; findings below may reference images not displayed]

ACR Breast Density Category c: The breast tissue is heterogeneously
dense, which may obscure small masses.
FINDINGS: There are no findings suspicious for malignancy.
IMPRESSION: No mammographic evidence of malignancy. A result letter of this
screening mammogram will be mailed directly to the patient.

RECOMMENDATION:
Screening mammogram in one year. (Code:Q3-W-BC3)

BI-RADS CATEGORY  1: Negative.

## 2023-04-17 ENCOUNTER — Ambulatory Visit (INDEPENDENT_AMBULATORY_CARE_PROVIDER_SITE_OTHER): Payer: Medicare HMO

## 2023-04-17 DIAGNOSIS — Z13820 Encounter for screening for osteoporosis: Secondary | ICD-10-CM

## 2023-04-17 DIAGNOSIS — Z780 Asymptomatic menopausal state: Secondary | ICD-10-CM

## 2023-05-01 NOTE — Telephone Encounter (Signed)
Erroneous encounter will close.

## 2023-05-26 DIAGNOSIS — H35372 Puckering of macula, left eye: Secondary | ICD-10-CM | POA: Diagnosis not present

## 2023-05-26 DIAGNOSIS — Z961 Presence of intraocular lens: Secondary | ICD-10-CM | POA: Diagnosis not present

## 2023-05-26 DIAGNOSIS — H2511 Age-related nuclear cataract, right eye: Secondary | ICD-10-CM | POA: Diagnosis not present

## 2023-05-26 DIAGNOSIS — H04123 Dry eye syndrome of bilateral lacrimal glands: Secondary | ICD-10-CM | POA: Diagnosis not present

## 2023-06-08 DIAGNOSIS — M79676 Pain in unspecified toe(s): Secondary | ICD-10-CM | POA: Diagnosis not present

## 2023-06-08 DIAGNOSIS — B351 Tinea unguium: Secondary | ICD-10-CM | POA: Diagnosis not present

## 2023-06-09 ENCOUNTER — Ambulatory Visit: Payer: Medicare HMO | Admitting: Family Medicine

## 2023-06-29 DIAGNOSIS — H2511 Age-related nuclear cataract, right eye: Secondary | ICD-10-CM | POA: Diagnosis not present

## 2023-06-29 DIAGNOSIS — H268 Other specified cataract: Secondary | ICD-10-CM | POA: Diagnosis not present

## 2023-07-03 ENCOUNTER — Ambulatory Visit: Payer: Medicare HMO | Admitting: Family Medicine

## 2023-07-17 ENCOUNTER — Encounter: Payer: Self-pay | Admitting: *Deleted

## 2023-08-11 ENCOUNTER — Ambulatory Visit: Payer: Medicare HMO | Admitting: Family Medicine

## 2023-08-17 DIAGNOSIS — M79676 Pain in unspecified toe(s): Secondary | ICD-10-CM | POA: Diagnosis not present

## 2023-08-17 DIAGNOSIS — B351 Tinea unguium: Secondary | ICD-10-CM | POA: Diagnosis not present

## 2023-08-21 ENCOUNTER — Encounter: Payer: Self-pay | Admitting: Family Medicine

## 2023-08-21 ENCOUNTER — Ambulatory Visit (INDEPENDENT_AMBULATORY_CARE_PROVIDER_SITE_OTHER): Payer: Medicare HMO | Admitting: Family Medicine

## 2023-08-21 VITALS — BP 186/88 | HR 78 | Ht 64.0 in | Wt 148.0 lb

## 2023-08-21 DIAGNOSIS — E782 Mixed hyperlipidemia: Secondary | ICD-10-CM | POA: Diagnosis not present

## 2023-08-21 DIAGNOSIS — I10 Essential (primary) hypertension: Secondary | ICD-10-CM | POA: Diagnosis not present

## 2023-08-21 DIAGNOSIS — M19041 Primary osteoarthritis, right hand: Secondary | ICD-10-CM

## 2023-08-21 DIAGNOSIS — Z79899 Other long term (current) drug therapy: Secondary | ICD-10-CM | POA: Diagnosis not present

## 2023-08-21 DIAGNOSIS — F5104 Psychophysiologic insomnia: Secondary | ICD-10-CM

## 2023-08-21 DIAGNOSIS — E039 Hypothyroidism, unspecified: Secondary | ICD-10-CM

## 2023-08-21 MED ORDER — PREDNISONE 20 MG PO TABS
ORAL_TABLET | ORAL | 0 refills | Status: DC
Start: 2023-08-21 — End: 2024-02-19

## 2023-08-21 NOTE — Addendum Note (Signed)
Addended by: Dorene Sorrow on: 08/21/2023 04:56 PM   Modules accepted: Orders

## 2023-08-21 NOTE — Progress Notes (Signed)
BP (!) 186/88   Pulse 78   Ht 5\' 4"  (1.626 m)   Wt 148 lb (67.1 kg)   SpO2 99%   BMI 25.40 kg/m    Subjective:   Patient ID: [name], female    DOB: 23-Nov-1941, 82 y.o.   MRN: [id number]  HPI: [name] is a 82 y.o. female presenting on 08/21/2023 for Medical Management of Chronic Issues, Hypothyroidism, Hyperlipidemia, and Hypertension   HPI Insomnia recheck Current rx- lunesta 2 mg at bedtime prn # meds rx- 30 per month, she saw the prescription for 6 months ago and has not used it so she does not need refill Effectiveness of current meds-works well when she has to use it but she has not needed it recently Adverse reactions form meds-none Pill count performed-No Last drug screen -08/12/2022 ( high risk q80m, moderate risk q16m, low risk yearly ) Urine drug screen today- Yes Was the [facility] reviewed-yes  If yes were their any concerning findings? -None Controlled substance contract signed on: Today  Hypothyroidism recheck Patient is coming in for thyroid recheck today as well. They deny any issues with hair changes or heat or cold problems or diarrhea or constipation. They deny any chest pain or palpitations. They are currently on levothyroxine   Hypertension Patient is currently on losartan and atenolol, and their blood pressure today is 186/88 but at home she takes her blood pressure every day and sometimes twice a day and is very consistently in the 104-125 over 60s. Patient denies any lightheadedness or dizziness. Patient denies headaches, blurred vision, chest pains, shortness of breath, or weakness. Denies any side effects from medication and is content with current medication.   Hyperlipidemia Patient is coming in for recheck of his hyperlipidemia. The patient is currently taking fish oil and zetia. They deny any issues with myalgias or history of liver damage from it. They deny any focal numbness or weakness or chest pain.   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  She has used some ibuprofen for a little while and it helped and then use some ice and heat for a little while and it helped but it still just bothering her and not going away completely  Relevant past medical, surgical, family and social history reviewed and updated as indicated. Interim medical history since our last visit reviewed. Allergies and medications reviewed and updated.  Review of Systems  Constitutional:  Negative for chills and fever.  Eyes:  Negative for redness and visual disturbance.  Respiratory:  Negative for chest tightness and shortness of breath.   Cardiovascular:  Negative for chest pain and leg swelling.  Musculoskeletal:  Positive for arthralgias and joint swelling. Negative for back pain and gait problem.  Skin:  Negative for rash.  Neurological:  Negative for light-headedness and headaches.  Psychiatric/Behavioral:  Negative for agitation and behavioral problems.   All other systems reviewed and are negative.   Per HPI unless specifically indicated above   Allergies as of 08/21/2023       Reactions   Penicillins    Statins    Muscle problems Muscle problems   Sulfa Antibiotics    Sulfonamide Derivatives         Medication List  Accurate as of August 21, 2023  4:40 PM. If you have any questions, ask your nurse or doctor.          atenolol 50 MG tablet Commonly known as: TENORMIN Take 2 tablets (100 mg total) by mouth daily.   ELDERBERRY PO Take by mouth.   eszopiclone 2 MG Tabs tablet Commonly known as: Lunesta Take 1 tablet (2 mg total) by mouth at bedtime as needed for sleep. Take immediately before bedtime   ezetimibe 10 MG tablet Commonly known as: ZETIA Take 1 tablet (10 mg total) by mouth daily.    Fish Oil 600 MG Caps Take 1,200 mg by mouth 2 (two) times daily.   GLUCOSAMINE CHONDR COMPLEX PO Take 600 mg by mouth daily.   levothyroxine 100 MCG tablet Commonly known as: SYNTHROID TAKE 1 TABLET BY MOUTH ONCE DAILY BEFORE BREAKFAST   losartan 50 MG tablet Commonly known as: COZAAR Take 1 tablet (50 mg total) by mouth daily.   multivitamin-lutein Caps capsule Take 1 capsule by mouth daily.   OSCAL 500/200 D-3 PO Take 1 capsule by mouth. Daily at lunchtime   Turmeric 500 MG Caps Take 1 capsule by mouth daily.   Vitamin D 50 MCG (2000 UT) Caps Take 1 capsule by mouth daily.         Objective:   BP (!) 186/88   Pulse 78   Ht 5\' 4"  (1.626 m)   Wt 148 lb (67.1 kg)   SpO2 99%   BMI 25.40 kg/m   Wt Readings from Last 3 Encounters:  08/21/23 148 lb (67.1 kg)  04/05/23 146 lb (66.2 kg)  02/08/23 146 lb (66.2 kg)    Physical Exam Vitals and nursing note reviewed.  Constitutional:      General: She is not in acute distress.    Appearance: She is well-developed. She is not diaphoretic.  Eyes:     Conjunctiva/sclera: Conjunctivae normal.  Cardiovascular:     Rate and Rhythm: Normal rate and regular rhythm.     Heart sounds: Normal heart sounds. No murmur heard. Pulmonary:     Effort: Pulmonary effort is normal. No respiratory distress.     Breath sounds: Normal breath sounds. No wheezing.  Musculoskeletal:     Right hand: Swelling and tenderness (Right middle finger, specifically the MCP joint) present. Decreased range of motion. Normal strength. Normal sensation.  Skin:    General: Skin is warm and dry.     Findings: No rash.  Neurological:     Mental Status: She is alert and oriented to person, place, and time.     Coordination: Coordination normal.  Psychiatric:        Behavior: Behavior normal.       Assessment & Plan:   Problem List Items Addressed This Visit       Cardiovascular and Mediastinum   Essential hypertension   Relevant Orders    CBC with Differential/Platelet   CMP14+EGFR   Lipid panel   TSH     Endocrine   Hypothyroidism - Primary   Relevant Orders   CBC with Differential/Platelet   CMP14+EGFR   Lipid panel   TSH     Other   Hyperlipidemia   Relevant Orders   CBC with Differential/Platelet   CMP14+EGFR   Lipid panel   TSH   Insomnia   Relevant Orders   CBC with Differential/Platelet   CMP14+EGFR   Lipid panel   TSH   Other Visit Diagnoses     Localized  osteoarthritis of right hand           Patient did not need Lunesta refilled today.  She seems to be doing pretty well with that.  Will get some prednisone for her arthritis flare see if that calms it down.  Her blood pressures look good at home so no necessary changes or medicines for that.   Follow up plan: Return in about 6 months (around 02/18/2024), or if symptoms worsen or fail to improve, for insomnia and hypothyroid recheck.  Counseling provided for all of the vaccine components Orders Placed This Encounter  Procedures   CBC with Differential/Platelet   CMP14+EGFR   Lipid panel   TSH    Arville Care, MD [facility] 08/21/2023, 4:40 PM

## 2023-08-22 DIAGNOSIS — L309 Dermatitis, unspecified: Secondary | ICD-10-CM | POA: Diagnosis not present

## 2023-08-22 DIAGNOSIS — L570 Actinic keratosis: Secondary | ICD-10-CM | POA: Diagnosis not present

## 2023-08-22 DIAGNOSIS — Z85828 Personal history of other malignant neoplasm of skin: Secondary | ICD-10-CM | POA: Diagnosis not present

## 2023-08-22 DIAGNOSIS — D485 Neoplasm of uncertain behavior of skin: Secondary | ICD-10-CM | POA: Diagnosis not present

## 2023-08-22 DIAGNOSIS — D2239 Melanocytic nevi of other parts of face: Secondary | ICD-10-CM | POA: Diagnosis not present

## 2023-08-22 LAB — CMP14+EGFR
ALT: 20 IU/L (ref 0–32)
AST: 24 IU/L (ref 0–40)
Albumin: 4.3 g/dL (ref 3.7–4.7)
Alkaline Phosphatase: 69 IU/L (ref 44–121)
BUN/Creatinine Ratio: 24 (ref 12–28)
BUN: 19 mg/dL (ref 8–27)
Bilirubin Total: 0.4 mg/dL (ref 0.0–1.2)
CO2: 25 mmol/L (ref 20–29)
Calcium: 9.4 mg/dL (ref 8.7–10.3)
Chloride: 100 mmol/L (ref 96–106)
Creatinine, Ser: 0.79 mg/dL (ref 0.57–1.00)
Globulin, Total: 2.9 g/dL (ref 1.5–4.5)
Glucose: 102 mg/dL — ABNORMAL HIGH (ref 70–99)
Potassium: 4 mmol/L (ref 3.5–5.2)
Sodium: 138 mmol/L (ref 134–144)
Total Protein: 7.2 g/dL (ref 6.0–8.5)
eGFR: 74 mL/min/{1.73_m2} (ref >59)

## 2023-08-22 LAB — CBC WITH DIFFERENTIAL/PLATELET
Basophils Absolute: 0.1 10*3/uL (ref 0.0–0.2)
Basos: 1 %
EOS (ABSOLUTE): 0.4 10*3/uL (ref 0.0–0.4)
Eos: 2 %
Hematocrit: 45.7 % (ref 34.0–46.6)
Hemoglobin: 15.2 g/dL (ref 11.1–15.9)
Immature Grans (Abs): 0 10*3/uL (ref 0.0–0.1)
Immature Granulocytes: 0 %
Lymphocytes Absolute: 13.4 10*3/uL — ABNORMAL HIGH (ref 0.7–3.1)
Lymphs: 61 %
MCH: 30.3 pg (ref 26.6–33.0)
MCHC: 33.3 g/dL (ref 31.5–35.7)
MCV: 91 fL (ref 79–97)
Monocytes Absolute: 1.9 10*3/uL — ABNORMAL HIGH (ref 0.1–0.9)
Monocytes: 9 %
Neutrophils Absolute: 5.9 10*3/uL (ref 1.4–7.0)
Neutrophils: 27 %
Platelets: 271 10*3/uL (ref 150–450)
RBC: 5.01 x10E6/uL (ref 3.77–5.28)
RDW: 12.4 % (ref 11.7–15.4)
WBC: 21.7 10*3/uL (ref 3.4–10.8)

## 2023-08-22 LAB — LIPID PANEL
Chol/HDL Ratio: 3.6 ratio (ref 0.0–4.4)
Cholesterol, Total: 190 mg/dL (ref 100–199)
HDL: 53 mg/dL (ref >39)
LDL Chol Calc (NIH): 110 mg/dL — ABNORMAL HIGH (ref 0–99)
Triglycerides: 156 mg/dL — ABNORMAL HIGH (ref 0–149)
VLDL Cholesterol Cal: 27 mg/dL (ref 5–40)

## 2023-08-22 LAB — TSH: TSH: 3.12 u[IU]/mL (ref 0.450–4.500)

## 2023-08-23 ENCOUNTER — Other Ambulatory Visit: Payer: Self-pay

## 2023-08-23 DIAGNOSIS — D729 Disorder of white blood cells, unspecified: Secondary | ICD-10-CM

## 2023-08-26 LAB — TOXASSURE SELECT 13 (MW), URINE

## 2023-08-29 ENCOUNTER — Other Ambulatory Visit: Payer: Medicare HMO

## 2023-09-04 ENCOUNTER — Other Ambulatory Visit: Payer: Medicare HMO

## 2023-09-04 DIAGNOSIS — D729 Disorder of white blood cells, unspecified: Secondary | ICD-10-CM

## 2023-09-04 LAB — CBC WITH DIFFERENTIAL/PLATELET
Basophils Absolute: 0.1 10*3/uL (ref 0.0–0.2)
Basos: 0 %
EOS (ABSOLUTE): 0.3 10*3/uL (ref 0.0–0.4)
Eos: 1 %
Hematocrit: 43 % (ref 34.0–46.6)
Hemoglobin: 14.4 g/dL (ref 11.1–15.9)
Immature Grans (Abs): 0 10*3/uL (ref 0.0–0.1)
Immature Granulocytes: 0 %
Lymphocytes Absolute: 15.4 10*3/uL — ABNORMAL HIGH (ref 0.7–3.1)
Lymphs: 73 %
MCH: 30.8 pg (ref 26.6–33.0)
MCHC: 33.5 g/dL (ref 31.5–35.7)
MCV: 92 fL (ref 79–97)
Monocytes Absolute: 0.9 10*3/uL (ref 0.1–0.9)
Monocytes: 4 %
Neutrophils Absolute: 4.8 10*3/uL (ref 1.4–7.0)
Neutrophils: 22 %
Platelets: 252 10*3/uL (ref 150–450)
RBC: 4.68 x10E6/uL (ref 3.77–5.28)
RDW: 12.1 % (ref 11.7–15.4)
WBC: 21.4 10*3/uL (ref 3.4–10.8)

## 2023-09-06 ENCOUNTER — Other Ambulatory Visit: Payer: Self-pay | Admitting: *Deleted

## 2023-09-06 DIAGNOSIS — D729 Disorder of white blood cells, unspecified: Secondary | ICD-10-CM

## 2023-09-07 ENCOUNTER — Telehealth: Payer: Self-pay | Admitting: Family Medicine

## 2023-09-07 NOTE — Telephone Encounter (Signed)
I think the WBC is still up because we did the steroids and I just want her to recheck that in a month which I did put on the result note.  With her finger still being swollen I do think it is arthritis but if she does want to go see a hand specialist we can place a referral for it and maybe they can do an injection directly into that joint but I do still think it is arthritis.

## 2023-09-07 NOTE — Telephone Encounter (Signed)
Pt called to let Dr Dettinger know that her finger is still very swollen and sore and she can't make a fist. Pt says she has taken the medication that was prescribed to her, but says it only helped a little. Wants to know if she should be concerned and if this is why her WBC is up?

## 2023-09-08 NOTE — Telephone Encounter (Signed)
Pt made aware of Dr.Dettinger's recommendations.  Lab appt made for 10/23 at 10am. Future lab orders have been placed.  Pt declines referral to hand specialist at this time.

## 2023-10-04 ENCOUNTER — Other Ambulatory Visit: Payer: Medicare HMO

## 2023-10-04 DIAGNOSIS — D729 Disorder of white blood cells, unspecified: Secondary | ICD-10-CM

## 2023-10-06 LAB — CBC WITH DIFFERENTIAL/PLATELET
Basophils Absolute: 0.1 10*3/uL (ref 0.0–0.2)
Basos: 1 %
EOS (ABSOLUTE): 0.3 10*3/uL (ref 0.0–0.4)
Eos: 2 %
Hematocrit: 44.5 % (ref 34.0–46.6)
Hemoglobin: 14.9 g/dL (ref 11.1–15.9)
Immature Grans (Abs): 0 10*3/uL (ref 0.0–0.1)
Immature Granulocytes: 0 %
Lymphocytes Absolute: 13 10*3/uL — ABNORMAL HIGH (ref 0.7–3.1)
Lymphs: 64 %
MCH: 31.1 pg (ref 26.6–33.0)
MCHC: 33.5 g/dL (ref 31.5–35.7)
MCV: 93 fL (ref 79–97)
Monocytes Absolute: 1.7 10*3/uL — ABNORMAL HIGH (ref 0.1–0.9)
Monocytes: 9 %
Neutrophils Absolute: 4.8 10*3/uL (ref 1.4–7.0)
Neutrophils: 24 %
Platelets: 260 10*3/uL (ref 150–450)
RBC: 4.79 x10E6/uL (ref 3.77–5.28)
RDW: 12.6 % (ref 11.7–15.4)
WBC: 19.8 10*3/uL — ABNORMAL HIGH (ref 3.4–10.8)

## 2023-10-09 ENCOUNTER — Other Ambulatory Visit: Payer: Self-pay

## 2023-10-09 DIAGNOSIS — D72829 Elevated white blood cell count, unspecified: Secondary | ICD-10-CM

## 2023-10-19 DIAGNOSIS — M79676 Pain in unspecified toe(s): Secondary | ICD-10-CM | POA: Diagnosis not present

## 2023-10-19 DIAGNOSIS — B351 Tinea unguium: Secondary | ICD-10-CM | POA: Diagnosis not present

## 2023-10-20 ENCOUNTER — Encounter: Payer: Self-pay | Admitting: Oncology

## 2023-10-20 ENCOUNTER — Inpatient Hospital Stay: Payer: Medicare HMO

## 2023-10-20 ENCOUNTER — Inpatient Hospital Stay: Payer: Medicare HMO | Attending: Oncology | Admitting: Oncology

## 2023-10-20 VITALS — BP 183/74 | HR 91 | Temp 98.2°F | Resp 19 | Ht 64.5 in | Wt 150.1 lb

## 2023-10-20 DIAGNOSIS — M1990 Unspecified osteoarthritis, unspecified site: Secondary | ICD-10-CM | POA: Diagnosis not present

## 2023-10-20 DIAGNOSIS — D72829 Elevated white blood cell count, unspecified: Secondary | ICD-10-CM

## 2023-10-20 DIAGNOSIS — E785 Hyperlipidemia, unspecified: Secondary | ICD-10-CM | POA: Insufficient documentation

## 2023-10-20 DIAGNOSIS — E039 Hypothyroidism, unspecified: Secondary | ICD-10-CM | POA: Insufficient documentation

## 2023-10-20 DIAGNOSIS — I10 Essential (primary) hypertension: Secondary | ICD-10-CM | POA: Diagnosis not present

## 2023-10-20 DIAGNOSIS — R0602 Shortness of breath: Secondary | ICD-10-CM | POA: Insufficient documentation

## 2023-10-20 DIAGNOSIS — U099 Post covid-19 condition, unspecified: Secondary | ICD-10-CM | POA: Diagnosis not present

## 2023-10-20 LAB — CBC WITH DIFFERENTIAL/PLATELET
Abs Immature Granulocytes: 0 10*3/uL (ref 0.00–0.07)
Basophils Absolute: 0 10*3/uL (ref 0.0–0.1)
Basophils Relative: 0 %
Eosinophils Absolute: 0.4 10*3/uL (ref 0.0–0.5)
Eosinophils Relative: 2 %
HCT: 44.5 % (ref 36.0–46.0)
Hemoglobin: 14.9 g/dL (ref 12.0–15.0)
Lymphocytes Relative: 59 %
Lymphs Abs: 12 10*3/uL — ABNORMAL HIGH (ref 0.7–4.0)
MCH: 31.6 pg (ref 26.0–34.0)
MCHC: 33.5 g/dL (ref 30.0–36.0)
MCV: 94.3 fL (ref 80.0–100.0)
Monocytes Absolute: 2.2 10*3/uL — ABNORMAL HIGH (ref 0.1–1.0)
Monocytes Relative: 11 %
Neutro Abs: 5.7 10*3/uL (ref 1.7–7.7)
Neutrophils Relative %: 28 %
Platelets: 268 10*3/uL (ref 150–400)
RBC: 4.72 MIL/uL (ref 3.87–5.11)
RDW: 12.8 % (ref 11.5–15.5)
WBC: 20.4 10*3/uL — ABNORMAL HIGH (ref 4.0–10.5)
nRBC: 0 % (ref 0.0–0.2)

## 2023-10-20 LAB — C-REACTIVE PROTEIN: CRP: 1.4 mg/dL — ABNORMAL HIGH (ref <1.0)

## 2023-10-20 LAB — LACTATE DEHYDROGENASE: LDH: 150 U/L (ref 98–192)

## 2023-10-20 LAB — SEDIMENTATION RATE: Sed Rate: 2 mm/h (ref 0–22)

## 2023-10-20 LAB — URIC ACID: Uric Acid, Serum: 3.8 mg/dL (ref 2.5–7.1)

## 2023-10-20 NOTE — Progress Notes (Signed)
[facility] HEMATOLOGY NEW VISIT  Dettinger, Elige Radon, MD  REASON FOR REFERRAL: Leukocytosis  SUMMARY OF HEMATOLOGIC HISTORY:    Latest Ref Rng & Units 10/20/2023    2:18 PM 10/04/2023    9:56 AM 09/04/2023   10:20 AM  CBC  WBC 4.0 - 10.5 K/uL 20.4  19.8  21.4   Hemoglobin 12.0 - 15.0 g/dL 16.1  09.6  04.5   Hematocrit 36.0 - 46.0 % 44.5  44.5  43.0   Platelets 150 - 400 K/uL 268  260  252     HISTORY OF PRESENT ILLNESS: Discussed the use of AI scribe software for clinical note transcription with the patient, who gave verbal consent to proceed.   [name] 82 y.o. female referred for leukocytosis.  She has a past medical history of hypertension, hypothyroidism and hyperlipidemia.  She had a recent episode of arthritis in her right middle finger for which she received prednisone for 5 days.  She had elevated white count during that time and repeat after 1 month still showed elevated white count and hence was referred to hematology. She reports a history of COVID-19 infection in 2020, which has resulted in persistent shortness of breath and difficulty walking. She has been working on exercises to strengthen her muscles, which she reports has helped. She denies fever, chills, night sweats, recent weight loss, loss of appetite, and abdominal pain.  She is doing well overall and is at baseline.  Patient is a non-smoker, uses alcohol occasionally.  No family history of cancer.  She lives alone and is very functional.  I have reviewed the past medical history, past surgical history, social history and family history with the patient   ALLERGIES:  is allergic to penicillins, statins, sulfa antibiotics, and sulfonamide derivatives.  MEDICATIONS:  Current Outpatient Medications  Medication Sig Dispense Refill   atenolol (TENORMIN) 50 MG tablet Take 2 tablets (100 mg total) by mouth daily. 180 tablet 3   Calcium Carbonate-Vitamin D (OSCAL  500/200 D-3 PO) Take 1 capsule by mouth. Daily at lunchtime     Cholecalciferol (VITAMIN D) 2000 units CAPS Take 1 capsule by mouth daily.     ELDERBERRY PO Take by mouth.     eszopiclone (LUNESTA) 2 MG TABS tablet Take 1 tablet (2 mg total) by mouth at bedtime as needed for sleep. Take immediately before bedtime 30 tablet 5   ezetimibe (ZETIA) 10 MG tablet Take 1 tablet (10 mg total) by mouth daily. 90 tablet 3   Glucosamine-Chondroitin (GLUCOSAMINE CHONDR COMPLEX PO) Take 600 mg by mouth daily.     levothyroxine (SYNTHROID) 100 MCG tablet TAKE 1 TABLET BY MOUTH ONCE DAILY BEFORE BREAKFAST 90 tablet 2   losartan (COZAAR) 50 MG tablet Take 1 tablet (50 mg total) by mouth daily. 90 tablet 3   multivitamin-lutein (OCUVITE-LUTEIN) CAPS Take 1 capsule by mouth daily.     Omega-3 Fatty Acids (FISH OIL) 600 MG CAPS Take 1,200 mg by mouth 2 (two) times daily.     predniSONE (DELTASONE) 20 MG tablet 2 po at same time daily for 5 days 10 tablet 0   Tavaborole 5 % SOLN Apply 1 Application topically daily.     Turmeric 500 MG CAPS Take 1 capsule by mouth daily.     No current facility-administered medications for this visit.     REVIEW OF SYSTEMS:   Constitutional: Denies fevers, chills or night sweats Eyes: Denies blurriness of vision Ears, nose,  mouth, throat, and face: Denies mucositis or sore throat Respiratory: Denies cough, dyspnea or wheezes Cardiovascular: Denies palpitation, chest discomfort or lower extremity swelling Gastrointestinal:  Denies nausea, heartburn or change in bowel habits Skin: Denies abnormal skin rashes Lymphatics: Denies new lymphadenopathy or easy bruising Neurological:Denies numbness, tingling or new weaknesses Behavioral/Psych: Mood is stable, no new changes  All other systems were reviewed with the patient and are negative.  PHYSICAL EXAMINATION:   Vitals:   10/20/23 1342  BP: (!) 183/74  Pulse: 91  Resp: 19  Temp: 98.2 F (36.8 C)  SpO2: 99%     GENERAL:alert, no distress and comfortable SKIN: skin color, texture, turgor are normal, no rashes, supportive keratosis noted LYMPH:  no palpable lymphadenopathy in the cervical, axillary or inguinal LUNGS: clear to auscultation and percussion with normal breathing effort HEART: regular rate & rhythm and no murmurs and left lower extremity edema ABDOMEN:abdomen soft, non-tender and normal bowel sounds Musculoskeletal:no cyanosis of digits and no clubbing  NEURO: alert & oriented x 3 with fluent speech  LABORATORY DATA:  I have reviewed the data as listed  Lab Results  Component Value Date   WBC 20.4 (H) 10/20/2023   NEUTROABS PENDING 10/20/2023   HGB 14.9 10/20/2023   HCT 44.5 10/20/2023   MCV 94.3 10/20/2023   PLT 268 10/20/2023      Component Value Date/Time   NA 138 08/21/2023 1653   K 4.0 08/21/2023 1653   CL 100 08/21/2023 1653   CO2 25 08/21/2023 1653   GLUCOSE 102 (H) 08/21/2023 1653   GLUCOSE 102 (H) 07/04/2013 1105   BUN 19 08/21/2023 1653   CREATININE 0.79 08/21/2023 1653   CREATININE 0.68 07/04/2013 1105   CALCIUM 9.4 08/21/2023 1653   PROT 7.2 08/21/2023 1653   ALBUMIN 4.3 08/21/2023 1653   AST 24 08/21/2023 1653   ALT 20 08/21/2023 1653   ALKPHOS 69 08/21/2023 1653   BILITOT 0.4 08/21/2023 1653   GFRNONAA 81 02/05/2021 0912   GFRNONAA 87 07/04/2013 1105   GFRAA 93 02/05/2021 0912   GFRAA >89 07/04/2013 1105       Chemistry      Component Value Date/Time   NA 138 08/21/2023 1653   K 4.0 08/21/2023 1653   CL 100 08/21/2023 1653   CO2 25 08/21/2023 1653   BUN 19 08/21/2023 1653   CREATININE 0.79 08/21/2023 1653   CREATININE 0.68 07/04/2013 1105      Component Value Date/Time   CALCIUM 9.4 08/21/2023 1653   ALKPHOS 69 08/21/2023 1653   AST 24 08/21/2023 1653   ALT 20 08/21/2023 1653   BILITOT 0.4 08/21/2023 1653       ASSESSMENT & PLAN:  Patient is an 82 year old female referred for leukocytosis   Leukocytosis Persistent elevation  over two months with lymphocyte predominance. Possible causes include recent prednisone use and inflammation from arthritis. Discussed the possibility of chronic lymphocytic leukemia (CLL) but reassured that counts are not extremely high and CLL often requires only monitoring.  Patient has no B symptoms. -Order inflammatory markers and flow cytometry to rule out CLL. -Follow-up in two weeks to discuss lab results.  Arthritis Arthritis of right middle finger.Persistent pain and limited mobility despite a course of prednisone. -Continue monitoring and management with primary care physician.  Post-COVID-19 syndrome Reports of shortness of breath and decreased exercise tolerance since recovering from COVID-19. -Consider further workup for potential cardiac involvement, to be discussed with primary care physician.   Orders Placed This Encounter  Procedures   CBC with Differential/Platelet    Standing Status:   Future    Number of Occurrences:   1    Standing Expiration Date:   10/19/2024   Uric acid    Standing Status:   Future    Number of Occurrences:   1    Standing Expiration Date:   10/19/2024   Lactate dehydrogenase    Standing Status:   Future    Number of Occurrences:   1    Standing Expiration Date:   10/19/2024   Flow Cytometry, Peripheral Blood (Oncology)    Standing Status:   Future    Number of Occurrences:   1    Standing Expiration Date:   10/19/2024   Sedimentation rate    Standing Status:   Future    Number of Occurrences:   1    Standing Expiration Date:   10/19/2024   C-reactive protein    Standing Status:   Future    Number of Occurrences:   1    Standing Expiration Date:   10/19/2024    The total time spent in the appointment was 40 minutes encounter with patients including review of chart and various tests results, discussions about plan of care and coordination of care plan   All questions were answered. The patient knows to call the clinic with any problems,  questions or concerns. No barriers to learning was detected.   Cindie Crumbly, MD 11/8/20243:24 PM

## 2023-10-20 NOTE — Patient Instructions (Signed)
VISIT SUMMARY:  [name] visited today to address several ongoing health concerns, including an elevated white blood cell count, arthritis in her right middle finger, post-COVID-19 syndrome, and lower extremity edema. We discussed potential causes and next steps for each issue.  YOUR PLAN:  -ELEVATED WHITE BLOOD CELL COUNT: Your white blood cell count has been higher than normal for several months. This could be due to the prednisone you are taking for arthritis or inflammation from the arthritis itself. We also discussed the possibility of chronic lymphocytic leukemia (CLL), but your counts are not extremely high, and CLL often only requires monitoring. We will order additional tests, including inflammatory markers and flow cytometry, to rule out leukemia. Please follow up in two weeks to discuss the lab results.  -ARTHRITIS IN RIGHT MIDDLE FINGER: You have persistent pain and limited mobility in your right middle finger due to arthritis, despite taking prednisone. Continue to monitor and manage this condition with your primary care physician.  INSTRUCTIONS:  Please follow up in two weeks to discuss the results of your lab tests. Continue working with your primary care physician to manage your arthritis, post-COVID-19 symptoms, and lower extremity edema.

## 2023-10-20 NOTE — Assessment & Plan Note (Signed)
Arthritis of right middle finger.Persistent pain and limited mobility despite a course of prednisone. -Continue monitoring and management with primary care physician.

## 2023-10-20 NOTE — Assessment & Plan Note (Signed)
Persistent elevation over two months with lymphocyte predominance. Possible causes include recent prednisone use and inflammation from arthritis. Discussed the possibility of chronic lymphocytic leukemia (CLL) but reassured that counts are not extremely high and CLL often requires only monitoring.  Patient has no B symptoms. -Order inflammatory markers and flow cytometry to rule out CLL. -Follow-up in two weeks to discuss lab results.

## 2023-10-20 NOTE — Assessment & Plan Note (Signed)
Reports of shortness of breath and decreased exercise tolerance since recovering from COVID-19. -Consider further workup for potential cardiac involvement, to be discussed with primary care physician.

## 2023-10-23 ENCOUNTER — Other Ambulatory Visit: Payer: Self-pay | Admitting: Oncology

## 2023-10-23 DIAGNOSIS — D72820 Lymphocytosis (symptomatic): Secondary | ICD-10-CM

## 2023-10-24 ENCOUNTER — Inpatient Hospital Stay: Payer: Medicare HMO

## 2023-10-24 ENCOUNTER — Other Ambulatory Visit: Payer: Self-pay

## 2023-10-24 DIAGNOSIS — D72829 Elevated white blood cell count, unspecified: Secondary | ICD-10-CM | POA: Diagnosis not present

## 2023-10-24 DIAGNOSIS — M1990 Unspecified osteoarthritis, unspecified site: Secondary | ICD-10-CM | POA: Diagnosis not present

## 2023-10-24 DIAGNOSIS — R0602 Shortness of breath: Secondary | ICD-10-CM | POA: Diagnosis not present

## 2023-10-24 DIAGNOSIS — U099 Post covid-19 condition, unspecified: Secondary | ICD-10-CM | POA: Diagnosis not present

## 2023-10-24 DIAGNOSIS — I10 Essential (primary) hypertension: Secondary | ICD-10-CM | POA: Diagnosis not present

## 2023-10-24 DIAGNOSIS — E785 Hyperlipidemia, unspecified: Secondary | ICD-10-CM | POA: Diagnosis not present

## 2023-10-24 DIAGNOSIS — D72820 Lymphocytosis (symptomatic): Secondary | ICD-10-CM

## 2023-10-24 DIAGNOSIS — E039 Hypothyroidism, unspecified: Secondary | ICD-10-CM | POA: Diagnosis not present

## 2023-10-24 LAB — CBC WITH DIFFERENTIAL/PLATELET
Abs Immature Granulocytes: 0 10*3/uL (ref 0.00–0.07)
Basophils Absolute: 0 10*3/uL (ref 0.0–0.1)
Basophils Relative: 0 %
Eosinophils Absolute: 1.2 10*3/uL — ABNORMAL HIGH (ref 0.0–0.5)
Eosinophils Relative: 6 %
HCT: 45.8 % (ref 36.0–46.0)
Hemoglobin: 14.8 g/dL (ref 12.0–15.0)
Lymphocytes Relative: 71 %
Lymphs Abs: 14.3 10*3/uL — ABNORMAL HIGH (ref 0.7–4.0)
MCH: 30.7 pg (ref 26.0–34.0)
MCHC: 32.3 g/dL (ref 30.0–36.0)
MCV: 95 fL (ref 80.0–100.0)
Monocytes Absolute: 0.4 10*3/uL (ref 0.1–1.0)
Monocytes Relative: 2 %
Neutro Abs: 4.2 10*3/uL (ref 1.7–7.7)
Neutrophils Relative %: 21 %
Platelets: 248 10*3/uL (ref 150–400)
RBC: 4.82 MIL/uL (ref 3.87–5.11)
RDW: 12.9 % (ref 11.5–15.5)
WBC: 20.2 10*3/uL — ABNORMAL HIGH (ref 4.0–10.5)
nRBC: 0 % (ref 0.0–0.2)

## 2023-10-25 LAB — SURGICAL PATHOLOGY

## 2023-10-30 LAB — FLOW CYTOMETRY

## 2023-11-06 ENCOUNTER — Inpatient Hospital Stay: Payer: Medicare HMO | Admitting: Oncology

## 2023-11-06 ENCOUNTER — Inpatient Hospital Stay: Payer: Medicare HMO

## 2023-11-06 VITALS — BP 180/90 | HR 75 | Temp 98.8°F | Resp 18 | Ht 64.0 in | Wt 151.0 lb

## 2023-11-06 DIAGNOSIS — D72829 Elevated white blood cell count, unspecified: Secondary | ICD-10-CM | POA: Diagnosis not present

## 2023-11-06 DIAGNOSIS — E039 Hypothyroidism, unspecified: Secondary | ICD-10-CM | POA: Diagnosis not present

## 2023-11-06 DIAGNOSIS — C9110 Chronic lymphocytic leukemia of B-cell type not having achieved remission: Secondary | ICD-10-CM | POA: Insufficient documentation

## 2023-11-06 DIAGNOSIS — U099 Post covid-19 condition, unspecified: Secondary | ICD-10-CM | POA: Diagnosis not present

## 2023-11-06 DIAGNOSIS — R0602 Shortness of breath: Secondary | ICD-10-CM | POA: Diagnosis not present

## 2023-11-06 DIAGNOSIS — E785 Hyperlipidemia, unspecified: Secondary | ICD-10-CM | POA: Diagnosis not present

## 2023-11-06 DIAGNOSIS — I10 Essential (primary) hypertension: Secondary | ICD-10-CM | POA: Diagnosis not present

## 2023-11-06 DIAGNOSIS — M1990 Unspecified osteoarthritis, unspecified site: Secondary | ICD-10-CM | POA: Diagnosis not present

## 2023-11-06 NOTE — Progress Notes (Addendum)
[facility] at [facility] HEMATOLOGY FOLLOW-UP VISIT  [name], [name] [name] Radon, MD  REASON FOR FOLLOW-UP: CLL  ASSESSMENT & PLAN:  Patient is a 82 year old female with recent diagnosis of CLL   CLL (chronic lymphocytic leukemia) (HCC) Newly diagnosed with flow cytometry.  RAI stage: 0.  No current indication for treatment.Discussed the importance of regular monitoring and potential symptoms of disease progression. -Obtain blood work every 3 months, coordinating with primary care provider in [place] if possible. -Advise patient to report symptoms such as night sweats, fever, chills, weight loss, abdominal pain, or loss of appetite. -Obtain baseline CLL FISH today.  RTC in 3 months with labs  Arthritis Patient continues to report right finger pain along with some swelling.  Discussed using anti-inflammatory for symptom management -Advise patient to take Tylenol or Advil (400mg  every 6 hours) as needed for pain, with caution to avoid overtaking Advil due to potential kidney damage.   Orders Placed This Encounter  Procedures   Chronic Lymphocytic Leukemia (CLL) Profile, FISH    The total time spent in the appointment was 20 minutes encounter with patients including review of chart and various tests results, discussions about plan of care and coordination of care plan   All questions were answered. The patient knows to call the clinic with any problems, questions or concerns. No barriers to learning was detected.  [name] Crumbly, MD 11/25/20244:42 PM   SUMMARY OF HEMATOLOGIC HISTORY: Persistent leukocytosis with lymphocyte predominance since February 2024 -Flow cytometry 10/20/2023: Consistent with CLL -RAI stage: 0   INTERVAL HISTORY: [name] [name] [name] 82 y.o. female is here for follow-up for CLL.  She reports some pain in her right hand fingers and swelling.  No other complaints today.  Denies fever, chills, nausea, vomiting, night sweats, weight loss,  loss of appetite.  Patient is active around the house and is able to manage everything by herself except driving since she has limited visibility through her left eye.   I have reviewed the past medical history, past surgical history, social history and family history with the patient   ALLERGIES:  is allergic to penicillins, statins, sulfa antibiotics, and sulfonamide derivatives.  MEDICATIONS:  Current Outpatient Medications  Medication Sig Dispense Refill   atenolol (TENORMIN) 50 MG tablet Take 2 tablets (100 mg total) by mouth daily. 180 tablet 3   Calcium Carbonate-Vitamin D (OSCAL 500/200 D-3 PO) Take 1 capsule by mouth. Daily at lunchtime     Cholecalciferol (VITAMIN D) 2000 units CAPS Take 1 capsule by mouth daily.     ELDERBERRY PO Take by mouth.     eszopiclone (LUNESTA) 2 MG TABS tablet Take 1 tablet (2 mg total) by mouth at bedtime as needed for sleep. Take immediately before bedtime 30 tablet 5   ezetimibe (ZETIA) 10 MG tablet Take 1 tablet (10 mg total) by mouth daily. 90 tablet 3   Glucosamine-Chondroitin (GLUCOSAMINE CHONDR COMPLEX PO) Take 600 mg by mouth daily.     levothyroxine (SYNTHROID) 100 MCG tablet TAKE 1 TABLET BY MOUTH ONCE DAILY BEFORE BREAKFAST 90 tablet 2   losartan (COZAAR) 50 MG tablet Take 1 tablet (50 mg total) by mouth daily. 90 tablet 3   multivitamin-lutein (OCUVITE-LUTEIN) CAPS Take 1 capsule by mouth daily.     Omega-3 Fatty Acids (FISH OIL) 600 MG CAPS Take 1,200 mg by mouth 2 (two) times daily.     predniSONE (DELTASONE) 20 MG tablet 2 po at same time daily for 5 days 10 tablet 0  Tavaborole 5 % SOLN Apply 1 Application topically daily.     Turmeric 500 MG CAPS Take 1 capsule by mouth daily.     No current facility-administered medications for this visit.     REVIEW OF SYSTEMS:   Constitutional: Denies fevers, chills or night sweats Eyes: Denies blurriness of vision Ears, nose, mouth, throat, and face: Denies mucositis or sore  throat Respiratory: Denies cough, dyspnea or wheezes Cardiovascular: Denies palpitation, chest discomfort or lower extremity swelling Gastrointestinal:  Denies nausea, heartburn or change in bowel habits Skin: Denies abnormal skin rashes Lymphatics: Denies new lymphadenopathy or easy bruising Neurological:Denies numbness, tingling or new weaknesses Behavioral/Psych: Mood is stable, no new changes  All other systems were reviewed with the patient and are negative.  PHYSICAL EXAMINATION:   Vitals:   11/06/23 0929  BP: (!) 180/90  Pulse: 75  Resp: 18  Temp: 98.8 F (37.1 C)  SpO2: 98%    GENERAL:alert, no distress and comfortable LYMPH:  no palpable lymphadenopathy in the cervical, axillary or inguinal LUNGS: clear to auscultation and percussion with normal breathing effort HEART: regular rate & rhythm and no murmurs and no lower extremity edema ABDOMEN:abdomen soft, non-tender and normal bowel sounds Musculoskeletal:no cyanosis of digits and no clubbing.  Swelling of right middle and right ring finger at the metacarpophalangeal joints. NEURO: alert & oriented x 3 with fluent speech  LABORATORY DATA:  I have reviewed the data as listed  Lab Results  Component Value Date   WBC 20.2 (H) 10/24/2023   NEUTROABS 4.2 10/24/2023   HGB 14.8 10/24/2023   HCT 45.8 10/24/2023   MCV 95.0 10/24/2023   PLT 248 10/24/2023      Component Value Date/Time   NA 138 08/21/2023 1653   K 4.0 08/21/2023 1653   CL 100 08/21/2023 1653   CO2 25 08/21/2023 1653   GLUCOSE 102 (H) 08/21/2023 1653   GLUCOSE 102 (H) 07/04/2013 1105   BUN 19 08/21/2023 1653   CREATININE 0.79 08/21/2023 1653   CREATININE 0.68 07/04/2013 1105   CALCIUM 9.4 08/21/2023 1653   PROT 7.2 08/21/2023 1653   ALBUMIN 4.3 08/21/2023 1653   AST 24 08/21/2023 1653   ALT 20 08/21/2023 1653   ALKPHOS 69 08/21/2023 1653   BILITOT 0.4 08/21/2023 1653   GFRNONAA 81 02/05/2021 0912   GFRNONAA 87 07/04/2013 1105   GFRAA 93  02/05/2021 0912   GFRAA >89 07/04/2013 1105       Chemistry      Component Value Date/Time   NA 138 08/21/2023 1653   K 4.0 08/21/2023 1653   CL 100 08/21/2023 1653   CO2 25 08/21/2023 1653   BUN 19 08/21/2023 1653   CREATININE 0.79 08/21/2023 1653   CREATININE 0.68 07/04/2013 1105      Component Value Date/Time   CALCIUM 9.4 08/21/2023 1653   ALKPHOS 69 08/21/2023 1653   AST 24 08/21/2023 1653   ALT 20 08/21/2023 1653   BILITOT 0.4 08/21/2023 1653     Flow cytometry:10/20/2023: DIAGNOSIS:   -Monoclonal B-cell population identified  -See comment   COMMENT:   The findings favor chronic lymphocytic leukemia.   GATING AND PHENOTYPIC ANALYSIS:   Gated population: Flow cytometric immunophenotyping is performed using  antibodies to the antigens listed in the table below. Electronic gates  are placed around a cell cluster displaying light scatter properties  corresponding to: lymphocytes (total lymphocyte count is14.3 K/uL)   Abnormal Cells in gated population: 75%   Phenotype of Abnormal Cells:  CD5, CD19, CD20, CD200, Kappa

## 2023-11-06 NOTE — Assessment & Plan Note (Signed)
Newly diagnosed with flow cytometry.  RAI stage: 0.  No current indication for treatment.Discussed the importance of regular monitoring and potential symptoms of disease progression. -Obtain blood work every 3 months, coordinating with primary care provider in [place] if possible. -Advise patient to report symptoms such as night sweats, fever, chills, weight loss, abdominal pain, or loss of appetite. -Obtain baseline CLL FISH today.  RTC in 3 months with labs

## 2023-11-06 NOTE — Addendum Note (Signed)
Addended byCindie Crumbly on: 11/06/2023 04:46 PM   Modules accepted: Orders

## 2023-11-06 NOTE — Assessment & Plan Note (Signed)
Patient continues to report right finger pain along with some swelling.  Discussed using anti-inflammatory for symptom management -Advise patient to take Tylenol or Advil (400mg  every 6 hours) as needed for pain, with caution to avoid overtaking Advil due to potential kidney damage.

## 2023-11-06 NOTE — Patient Instructions (Signed)
VISIT SUMMARY:  You were seen today for a consultation regarding your recent diagnosis of Chronic Lymphocytic Leukemia (CLL) and other health concerns. We discussed the importance of regular monitoring for CLL, as well as management strategies for your finger pain and lower extremity swelling.  YOUR PLAN:  -CHRONIC LYMPHOCYTIC LEUKEMIA (CLL): CLL is a type of cancer that affects the blood and bone marrow. Currently, your condition does not require treatment, but it is important to monitor it regularly. You should get blood work done every 3 months, ideally coordinated with your primary care provider in [place]. Please report any symptoms such as night sweats, fever, chills, weight loss, abdominal pain, or loss of appetite. We will also obtain baseline blood work today to check for leukemia mutations.  -FINGER PAIN: Your finger pain is likely due to inflammation. You can manage the pain by taking Tylenol or Advil (400mg  every 6 hours) as needed. Be cautious not to take too much Advil, as it can cause kidney damage.  -LOWER EXTREMITY SWELLING: The swelling in your legs is likely related to age. To manage this, you should elevate your legs, especially at night.  -GENERAL HEALTH MAINTENANCE: Please monitor for any abnormal swelling and explore transportation options with your insurance for medical appointments.  INSTRUCTIONS:  Please follow up with blood work every 3 months, coordinating with your primary care provider in [place] if possible. Report any new symptoms such as night sweats, fever, chills, weight loss, abdominal pain, or loss of appetite. Additionally, obtain baseline blood work today to assess for leukemia mutations.

## 2023-11-13 LAB — FISH HES LEUKEMIA, 4Q12 REA

## 2023-11-15 ENCOUNTER — Other Ambulatory Visit: Payer: Self-pay | Admitting: Family Medicine

## 2023-11-15 DIAGNOSIS — E039 Hypothyroidism, unspecified: Secondary | ICD-10-CM

## 2023-12-25 ENCOUNTER — Other Ambulatory Visit: Payer: Self-pay | Admitting: Family Medicine

## 2023-12-25 DIAGNOSIS — Z1231 Encounter for screening mammogram for malignant neoplasm of breast: Secondary | ICD-10-CM

## 2024-01-23 ENCOUNTER — Other Ambulatory Visit: Payer: Medicare HMO

## 2024-01-23 DIAGNOSIS — C9110 Chronic lymphocytic leukemia of B-cell type not having achieved remission: Secondary | ICD-10-CM | POA: Diagnosis not present

## 2024-01-23 LAB — CBC WITH DIFFERENTIAL/PLATELET

## 2024-01-24 LAB — CBC WITH DIFFERENTIAL/PLATELET
Basos: 1 %
EOS (ABSOLUTE): 0.1 10*3/uL (ref 0.0–0.2)
Eos: 1 %
Hematocrit: 45.9 % (ref 34.0–46.6)
Hemoglobin: 15 g/dL (ref 11.1–15.9)
Immature Granulocytes: 0 %
Immature Granulocytes: 0 10*3/uL (ref 0.0–0.1)
Lymphs: 73 %
MCH: 30.4 pg (ref 26.6–33.0)
MCHC: 32.7 g/dL (ref 31.5–35.7)
MCV: 93 fL (ref 79–97)
Monocytes Absolute: 0.3 10*3/uL (ref 0.0–0.4)
Monocytes Absolute: 0.9 10*3/uL (ref 0.1–0.9)
Monocytes: 4 %
Neutrophils Absolute: 16.7 10*3/uL — ABNORMAL HIGH (ref 0.7–3.1)
Neutrophils Absolute: 4.7 10*3/uL (ref 1.4–7.0)
Neutrophils: 21 %
Platelets: 283 10*3/uL (ref 150–450)
RBC: 4.93 x10E6/uL (ref 3.77–5.28)
RDW: 12.1 % (ref 11.7–15.4)
WBC: 22.7 10*3/uL (ref 3.4–10.8)

## 2024-01-24 LAB — COMPREHENSIVE METABOLIC PANEL
ALT: 24 [IU]/L (ref 0–32)
AST: 21 [IU]/L (ref 0–40)
Albumin: 4.4 g/dL (ref 3.7–4.7)
Alkaline Phosphatase: 73 [IU]/L (ref 44–121)
BUN/Creatinine Ratio: 30 — ABNORMAL HIGH (ref 12–28)
BUN: 20 mg/dL (ref 8–27)
Bilirubin Total: 0.6 mg/dL (ref 0.0–1.2)
CO2: 25 mmol/L (ref 20–29)
Calcium: 9.6 mg/dL (ref 8.7–10.3)
Chloride: 101 mmol/L (ref 96–106)
Creatinine, Ser: 0.67 mg/dL (ref 0.57–1.00)
Globulin, Total: 2.8 g/dL (ref 1.5–4.5)
Glucose: 91 mg/dL (ref 70–99)
Potassium: 4.2 mmol/L (ref 3.5–5.2)
Sodium: 140 mmol/L (ref 134–144)
Total Protein: 7.2 g/dL (ref 6.0–8.5)
eGFR: 87 mL/min/{1.73_m2} (ref >59)

## 2024-01-24 LAB — URIC ACID: Uric Acid: 4.2 mg/dL (ref 3.1–7.9)

## 2024-01-24 LAB — LACTATE DEHYDROGENASE: LDH: 216 IU/L (ref 119–226)

## 2024-02-05 ENCOUNTER — Inpatient Hospital Stay: Payer: Medicare HMO | Attending: Oncology | Admitting: Oncology

## 2024-02-05 VITALS — BP 192/81 | HR 96 | Temp 97.6°F | Resp 20 | Wt 152.4 lb

## 2024-02-05 DIAGNOSIS — C9110 Chronic lymphocytic leukemia of B-cell type not having achieved remission: Secondary | ICD-10-CM | POA: Diagnosis not present

## 2024-02-05 NOTE — Progress Notes (Signed)
 [facility] at [facility]  HEMATOLOGY FOLLOW-UP VISIT  Dettinger, Elige Radon, MD  REASON FOR FOLLOW-UP: CLL  ASSESSMENT & PLAN:  Patient is an 83 year old female with asymptomatic CLL   CLL (chronic lymphocytic leukemia) (HCC) Newly diagnosed with flow cytometry.  CLL FISH consistent with trisomy 12.  RAI stage: 0.  No current indication for treatment. Discussed the importance of regular monitoring and potential symptoms of disease progression.  Slightly uptrending lymphocyte count.  Patient is asymptomatic. -Advise patient to report symptoms such as night sweats, fever, chills, weight loss, abdominal pain, or loss of appetite. -Patient would like to visit her son in [place].  Will prefer next follow-up in 6 months.  RTC in 6 months with labs   Orders Placed This Encounter  Procedures   CBC with Differential/Platelet    Standing Status:   Future    Expected Date:   07/29/2024    Expiration Date:   02/04/2025   Comprehensive metabolic panel    Standing Status:   Future    Expected Date:   07/29/2024    Expiration Date:   02/04/2025   Lactate dehydrogenase    Standing Status:   Future    Expected Date:   07/29/2024    Expiration Date:   02/04/2025   Uric acid    Standing Status:   Future    Expected Date:   07/29/2024    Expiration Date:   02/04/2025    The total time spent in the appointment was 20 minutes encounter with patients including review of chart and various tests results, discussions about plan of care and coordination of care plan   All questions were answered. The patient knows to call the clinic with any problems, questions or concerns. No barriers to learning was detected.  Cindie Crumbly, MD 2/24/20252:27 PM   SUMMARY OF HEMATOLOGIC HISTORY: -10/20/2023: Flow cytometry: CLL -CLL FISH: Trisomy 12. Results for  CCND1/IGH, ATM, 13q and TP53 were normal.   INTERVAL HISTORY: [name] 83 y.o. female following for asymptomatic  CLL.  She is accompanied by her friend today.She reports feeling well overall, with no significant fatigue despite being active around the house from early morning until the afternoon. She denies any new symptoms such as fever, chills, night sweats, or increased infections. The patient is also planning to travel to visit her children in [place] and [place] in the near future.  She stated that she has been trying to eat healthier which she was not doing after her husband died.   I have reviewed the past medical history, past surgical history, social history and family history with the patient   ALLERGIES:  is allergic to penicillins, statins, sulfa antibiotics, and sulfonamide derivatives.  MEDICATIONS:  Current Outpatient Medications  Medication Sig Dispense Refill   atenolol (TENORMIN) 50 MG tablet Take 2 tablets (100 mg total) by mouth daily. 180 tablet 3   Calcium Carbonate-Vitamin D (OSCAL 500/200 D-3 PO) Take 1 capsule by mouth. Daily at lunchtime     Cholecalciferol (VITAMIN D) 2000 units CAPS Take 1 capsule by mouth daily.     ELDERBERRY PO Take by mouth.     eszopiclone (LUNESTA) 2 MG TABS tablet Take 1 tablet (2 mg total) by mouth at bedtime as needed for sleep. Take immediately before bedtime 30 tablet 5   ezetimibe (ZETIA) 10 MG tablet Take 1 tablet (10 mg total) by mouth daily. 90 tablet 3   Glucosamine-Chondroitin (GLUCOSAMINE CHONDR COMPLEX PO) Take 600  mg by mouth daily.     levothyroxine (SYNTHROID) 100 MCG tablet TAKE 1 TABLET BY MOUTH ONCE DAILY BEFORE BREAKFAST 90 tablet 2   losartan (COZAAR) 50 MG tablet Take 1 tablet (50 mg total) by mouth daily. 90 tablet 3   multivitamin-lutein (OCUVITE-LUTEIN) CAPS Take 1 capsule by mouth daily.     Omega-3 Fatty Acids (FISH OIL) 600 MG CAPS Take 1,200 mg by mouth 2 (two) times daily.     predniSONE (DELTASONE) 20 MG tablet 2 po at same time daily for 5 days 10 tablet 0   Tavaborole 5 % SOLN Apply 1 Application topically daily.      Turmeric 500 MG CAPS Take 1 capsule by mouth daily.     No current facility-administered medications for this visit.     REVIEW OF SYSTEMS:   Constitutional: Denies fevers, chills or night sweats Eyes: Denies blurriness of vision Ears, nose, mouth, throat, and face: Denies mucositis or sore throat Respiratory: Denies cough, dyspnea or wheezes Cardiovascular: Denies palpitation, chest discomfort or lower extremity swelling Gastrointestinal:  Denies nausea, heartburn or change in bowel habits Skin: Denies abnormal skin rashes Lymphatics: Denies new lymphadenopathy or easy bruising Neurological:Denies numbness, tingling or new weaknesses Behavioral/Psych: Mood is stable, no new changes  All other systems were reviewed with the patient and are negative.  PHYSICAL EXAMINATION:   Vitals:   02/05/24 1357  BP: (!) 192/81  Pulse: 96  Resp: 20  Temp: 97.6 F (36.4 C)  SpO2: 99%    GENERAL:alert, no distress and comfortable LYMPH:  no palpable lymphadenopathy in the cervical, axillary or inguinal LUNGS: clear to auscultation and percussion with normal breathing effort HEART: regular rate & rhythm and no murmurs and no lower extremity edema ABDOMEN:abdomen soft, non-tender and normal bowel sounds Musculoskeletal:no cyanosis of digits and no clubbing  NEURO: alert & oriented x 3 with fluent speech  LABORATORY DATA:  I have reviewed the data as listed   Latest Reference Range & Units 08/21/23 16:53 09/04/23 10:20 10/04/23 09:56 10/20/23 14:18 10/24/23 10:44 01/23/24 09:56  WBC 3.4 - 10.8 x10E3/uL 21.7 (HH) 21.4 (HH) 19.8 (H) 20.4 (H) 20.2 (H) 22.7 (HH)  RBC 3.77 - 5.28 x10E6/uL 5.01 4.68 4.79 4.72 4.82 4.93  Hemoglobin 11.1 - 15.9 g/dL 16.1 09.6 04.5 40.9 81.1 15.0  HCT 34.0 - 46.6 % 45.7 43.0 44.5 44.5 45.8 45.9  MCV 79 - 97 fL 91 92 93 94.3 95.0 93  MCH 26.6 - 33.0 pg 30.3 30.8 31.1 31.6 30.7 30.4  MCHC 31.5 - 35.7 g/dL 91.4 78.2 95.6 21.3 08.6 32.7  RDW 11.7 - 15.4 % 12.4 12.1  12.6 12.8 12.9 12.1  Platelets 150 - 450 x10E3/uL 271 252 260 268 248 283  MPV 0 - 99.8 fL        nRBC 0.0 - 0.2 %    0.0 0.0   Hematology Comments:       Note:  Neutrophils Not Estab. % 27 22 24 28 21 21   Lymphocytes %    59 71   Monocytes Relative %    11 2   Eosinophil %    2 6   Basophil %    0 0   Immature Granulocytes Not Estab. % 0 0 0   0  NEUT# 1.4 - 7.0 x10E3/uL 5.9 4.8 4.8 5.7 4.2 4.7  Lymphs Abs 0.7 - 3.1 x10E3/uL 13.4 (H) 15.4 (H) 13.0 (H) 12.0 (H) 14.3 (H) 16.7 (H)  Monocyte # 0.1 - 1.0 K/uL  2.2 (H) 0.4   Monocytes Absolute 0.1 - 0.9 x10E3/uL 1.9 (H) 0.9 1.7 (H)   0.9  Eosinophils Absolute 0.0 - 0.5 K/uL    0.4 1.2 (H)   Basophils Absolute 0.0 - 0.2 x10E3/uL 0.1 0.1 0.1 0.0 0.0 0.1  Abs Immature Granulocytes 0.00 - 0.07 K/uL    0.00 0.00   Immature Grans (Abs) 0.0 - 0.1 x10E3/uL 0.0 0.0 0.0   0.0  Lymphs Not Estab. % 61 73 64   73  Monocytes Not Estab. % 9 4 9   4   Basos Not Estab. % 1 0 1   1  (HH): Data is critically high (H): Data is abnormally high       Chemistry      Component Value Date/Time   NA 140 01/23/2024 0956   K 4.2 01/23/2024 0956   CL 101 01/23/2024 0956   CO2 25 01/23/2024 0956   BUN 20 01/23/2024 0956   CREATININE 0.67 01/23/2024 0956   CREATININE 0.68 07/04/2013 1105      Component Value Date/Time   CALCIUM 9.6 01/23/2024 0956   ALKPHOS 73 01/23/2024 0956   AST 21 01/23/2024 0956   ALT 24 01/23/2024 0956   BILITOT 0.6 01/23/2024 0956      Latest Reference Range & Units 10/20/23 14:18 01/23/24 09:56  LDH 119 - 226 IU/L 150 216    Latest Reference Range & Units 10/20/23 14:18  Uric Acid, Serum 2.5 - 7.1 mg/dL 3.8

## 2024-02-05 NOTE — Assessment & Plan Note (Signed)
 Newly diagnosed with flow cytometry.  CLL FISH consistent with trisomy 12.  RAI stage: 0.  No current indication for treatment. Discussed the importance of regular monitoring and potential symptoms of disease progression.  Slightly uptrending lymphocyte count.  Patient is asymptomatic. -Advise patient to report symptoms such as night sweats, fever, chills, weight loss, abdominal pain, or loss of appetite. -Patient would like to visit her son in [place].  Will prefer next follow-up in 6 months.  RTC in 6 months with labs

## 2024-02-05 NOTE — Patient Instructions (Signed)
 VISIT SUMMARY:  You had a follow-up appointment today to check on your Chronic Lymphocytic Leukemia (CLL). You reported feeling well and did not have any new symptoms. You also mentioned your upcoming travel plans to visit your children in [place] and [place].  YOUR PLAN:  -CHRONIC LYMPHOCYTIC LEUKEMIA (CLL): Chronic Lymphocytic Leukemia (CLL) is a type of cancer that affects the blood and bone marrow. Your condition is stable with a slightly elevated white blood cell count, but there are no signs of anemia or a decrease in platelets. You do not have symptoms like fatigue, fever, chills, night sweats, or increased infections. We will continue with your current management and monitoring plan. Please return for a follow-up in six months (August 2025). Contact our office if you experience any changes, including fever, chills, fatigue, weight loss, or loss of appetite.  INSTRUCTIONS:  Please return for a follow-up appointment in six months (August 2025). Contact our office if you experience any changes, including fever, chills, fatigue, weight loss, or loss of appetite.

## 2024-02-07 ENCOUNTER — Ambulatory Visit
Admission: RE | Admit: 2024-02-07 | Discharge: 2024-02-07 | Disposition: A | Discharge status: Home / Self Care | Payer: Medicare HMO | Source: Ambulatory Visit | Attending: Family Medicine

## 2024-02-07 DIAGNOSIS — Z1231 Encounter for screening mammogram for malignant neoplasm of breast: Secondary | ICD-10-CM | POA: Diagnosis not present

## 2024-02-12 ENCOUNTER — Encounter: Payer: Self-pay | Admitting: Family Medicine

## 2024-02-13 DIAGNOSIS — H35372 Puckering of macula, left eye: Secondary | ICD-10-CM | POA: Diagnosis not present

## 2024-02-13 DIAGNOSIS — Z961 Presence of intraocular lens: Secondary | ICD-10-CM | POA: Diagnosis not present

## 2024-02-13 DIAGNOSIS — H04123 Dry eye syndrome of bilateral lacrimal glands: Secondary | ICD-10-CM | POA: Diagnosis not present

## 2024-02-18 ENCOUNTER — Other Ambulatory Visit: Payer: Self-pay | Admitting: Family Medicine

## 2024-02-18 DIAGNOSIS — I10 Essential (primary) hypertension: Secondary | ICD-10-CM

## 2024-02-19 ENCOUNTER — Encounter: Payer: Self-pay | Admitting: Family Medicine

## 2024-02-19 ENCOUNTER — Ambulatory Visit (INDEPENDENT_AMBULATORY_CARE_PROVIDER_SITE_OTHER): Payer: Medicare HMO | Admitting: Family Medicine

## 2024-02-19 VITALS — BP 160/78 | HR 85 | Ht 64.0 in | Wt 151.0 lb

## 2024-02-19 DIAGNOSIS — I10 Essential (primary) hypertension: Secondary | ICD-10-CM

## 2024-02-19 DIAGNOSIS — E782 Mixed hyperlipidemia: Secondary | ICD-10-CM

## 2024-02-19 DIAGNOSIS — F5104 Psychophysiologic insomnia: Secondary | ICD-10-CM | POA: Diagnosis not present

## 2024-02-19 DIAGNOSIS — E039 Hypothyroidism, unspecified: Secondary | ICD-10-CM | POA: Diagnosis not present

## 2024-02-19 MED ORDER — EZETIMIBE 10 MG PO TABS: 10.0000 mg | ORAL_TABLET | Freq: Every day | ORAL | 3 refills | Status: AC

## 2024-02-19 MED ORDER — ATENOLOL 50 MG PO TABS: 100.0000 mg | ORAL_TABLET | Freq: Every day | ORAL | 3 refills | Status: AC

## 2024-02-19 MED ORDER — LOSARTAN POTASSIUM 50 MG PO TABS
50.0000 mg | ORAL_TABLET | Freq: Every day | ORAL | 3 refills | Status: AC
Start: 2024-02-19 — End: ?

## 2024-02-19 NOTE — Progress Notes (Signed)
 BP (!) 160/78   Pulse 85   Ht 5\' 4"  (1.626 m)   Wt 151 lb (68.5 kg)   SpO2 99%   BMI 25.92 kg/m    Subjective:   Patient ID: [name], female    DOB: [birth year], 83 y.o.   MRN: [id number]  HPI: [name] is a 83 y.o. female presenting on 02/19/2024 for Medical Management of Chronic Issues, Hypertension, Hypothyroidism, and Hyperlipidemia   HPI Hypothyroidism recheck Patient is coming in for thyroid recheck today as well. They deny any issues with hair changes or heat or cold problems or diarrhea or constipation. They deny any chest pain or palpitations. They are currently on levothyroxine 100 micrograms   Hypertension Patient is currently on atenolol and losartan, and their blood pressure today is 168/74 but she checks her blood pressure at home every day and it runs in the 120s and 110s over 60s. Patient denies any lightheadedness or dizziness. Patient denies headaches, blurred vision, chest pains, shortness of breath, or weakness. Denies any side effects from medication and is content with current medication.   Hyperlipidemia Patient is coming in for recheck of his hyperlipidemia. The patient is currently taking fish oils and Zetia. They deny any issues with myalgias or history of liver damage from it. They deny any focal numbness or weakness or chest pain.   Insomnia recheck Current rx-Lunesta 2 mg nightly.  She has not used hardly at all and she is sleeping well without it so she does not feel like she needs it right now # meds rx-30/month Effectiveness of current meds-she likes it but has not been able to get it because of insurance coverage recently Adverse reactions form meds-none Pill count performed-No Last drug screen -08/31/2023 ( high risk q11m, moderate risk q62m, low risk yearly ) Urine drug screen today- No Was the NCCSR reviewed-yes  If yes were their any concerning findings? -None Controlled substance contract signed on:  08/31/2023  Relevant past medical, surgical, family and social history reviewed and updated as indicated. Interim medical history since our last visit reviewed. Allergies and medications reviewed and updated.  Review of Systems  Constitutional:  Negative for chills and fever.  Eyes:  Negative for visual disturbance.  Respiratory:  Negative for chest tightness and shortness of breath.   Cardiovascular:  Negative for chest pain and leg swelling.  Genitourinary:  Negative for difficulty urinating and dysuria.  Musculoskeletal:  Negative for back pain and gait problem.  Skin:  Negative for rash.  Neurological:  Negative for dizziness, light-headedness and headaches.  Psychiatric/Behavioral:  Negative for agitation, behavioral problems and sleep disturbance.   All other systems reviewed and are negative.   Per HPI unless specifically indicated above   Allergies as of 02/19/2024       Reactions   Penicillins    Statins    Muscle problems Muscle problems   Sulfa Antibiotics    Sulfonamide Derivatives         Medication List        Accurate as of February 19, 2024  8:28 AM. If you have any questions, ask your nurse or doctor.          STOP taking these medications    predniSONE 20 MG tablet Commonly known as: DELTASONE Stopped by: Elige Radon Cordero Surette       TAKE these medications    atenolol 50 MG tablet Commonly known as: TENORMIN Take 2 tablets (100 mg total) by mouth daily.  ELDERBERRY PO Take by mouth.   eszopiclone 2 MG Tabs tablet Commonly known as: Lunesta Take 1 tablet (2 mg total) by mouth at bedtime as needed for sleep. Take immediately before bedtime   ezetimibe 10 MG tablet Commonly known as: ZETIA Take 1 tablet (10 mg total) by mouth daily.   Fish Oil 600 MG Caps Take 1,200 mg by mouth 2 (two) times daily.   GLUCOSAMINE CHONDR COMPLEX PO Take 600 mg by mouth daily.   levothyroxine 100 MCG tablet Commonly known as: SYNTHROID TAKE 1 TABLET BY  MOUTH ONCE DAILY BEFORE BREAKFAST   losartan 50 MG tablet Commonly known as: COZAAR Take 1 tablet (50 mg total) by mouth daily.   multivitamin-lutein Caps capsule Take 1 capsule by mouth daily.   OSCAL 500/200 D-3 PO Take 1 capsule by mouth. Daily at lunchtime   Tavaborole 5 % Soln Apply 1 Application topically daily.   Turmeric 500 MG Caps Take 1 capsule by mouth daily.   Vitamin D 50 MCG (2000 UT) Caps Take 1 capsule by mouth daily.         Objective:   BP (!) 160/78   Pulse 85   Ht 5\' 4"  (1.626 m)   Wt 151 lb (68.5 kg)   SpO2 99%   BMI 25.92 kg/m   Wt Readings from Last 3 Encounters:  02/19/24 151 lb (68.5 kg)  02/05/24 152 lb 6.4 oz (69.1 kg)  11/06/23 151 lb (68.5 kg)    Physical Exam Vitals and nursing note reviewed.  Constitutional:      General: She is not in acute distress.    Appearance: She is well-developed. She is not diaphoretic.  Eyes:     Conjunctiva/sclera: Conjunctivae normal.  Cardiovascular:     Rate and Rhythm: Normal rate and regular rhythm.     Heart sounds: Normal heart sounds. No murmur heard. Pulmonary:     Effort: Pulmonary effort is normal. No respiratory distress.     Breath sounds: Normal breath sounds. No wheezing.  Musculoskeletal:        General: No tenderness. Normal range of motion.  Skin:    General: Skin is warm and dry.     Findings: No rash.  Neurological:     Mental Status: She is alert and oriented to person, place, and time.     Coordination: Coordination normal.  Psychiatric:        Behavior: Behavior normal.       Assessment & Plan:   Problem List Items Addressed This Visit       Cardiovascular and Mediastinum   Essential hypertension   Relevant Medications   atenolol (TENORMIN) 50 MG tablet   ezetimibe (ZETIA) 10 MG tablet   losartan (COZAAR) 50 MG tablet   Other Relevant Orders   CMP14+EGFR     Endocrine   Hypothyroidism - Primary   Relevant Medications   atenolol (TENORMIN) 50 MG tablet    Other Relevant Orders   TSH     Other   Hyperlipidemia   Relevant Medications   atenolol (TENORMIN) 50 MG tablet   ezetimibe (ZETIA) 10 MG tablet   losartan (COZAAR) 50 MG tablet   Other Relevant Orders   Lipid panel   Insomnia    Patient feels like she is sleeping well does not need the Lunesta anymore at this point.  She will call back if she does need it and come back in for an appointment.  Patient was recently diagnosed with chronic lymphocytic leukemia  at hematology.  Per her they said they are stable and she goes every 6 months. Follow up plan: Return in about 6 months (around 08/21/2024), or if symptoms worsen or fail to improve, for Hypothyroidism hypertension and hyperlipidemia.  Counseling provided for all of the vaccine components Orders Placed This Encounter  Procedures   CMP14+EGFR   Lipid panel   TSH    Arville Care, MD [facility] 02/19/2024, 8:28 AM

## 2024-02-20 DIAGNOSIS — L570 Actinic keratosis: Secondary | ICD-10-CM | POA: Diagnosis not present

## 2024-02-20 DIAGNOSIS — Z85828 Personal history of other malignant neoplasm of skin: Secondary | ICD-10-CM | POA: Diagnosis not present

## 2024-02-20 DIAGNOSIS — L218 Other seborrheic dermatitis: Secondary | ICD-10-CM | POA: Diagnosis not present

## 2024-02-20 LAB — LIPID PANEL
Chol/HDL Ratio: 3.1 ratio (ref 0.0–4.4)
Cholesterol, Total: 170 mg/dL (ref 100–199)
HDL: 55 mg/dL (ref >39)
LDL Chol Calc (NIH): 95 mg/dL (ref 0–99)
Triglycerides: 110 mg/dL (ref 0–149)
VLDL Cholesterol Cal: 20 mg/dL (ref 5–40)

## 2024-02-20 LAB — CMP14+EGFR
ALT: 22 IU/L (ref 0–32)
AST: 18 IU/L (ref 0–40)
Albumin: 4.1 g/dL (ref 3.7–4.7)
Alkaline Phosphatase: 66 IU/L (ref 44–121)
BUN/Creatinine Ratio: 43 — ABNORMAL HIGH (ref 12–28)
BUN: 32 mg/dL — ABNORMAL HIGH (ref 8–27)
Bilirubin Total: 0.7 mg/dL (ref 0.0–1.2)
CO2: 26 mmol/L (ref 20–29)
Calcium: 9.9 mg/dL (ref 8.7–10.3)
Chloride: 102 mmol/L (ref 96–106)
Creatinine, Ser: 0.74 mg/dL (ref 0.57–1.00)
Globulin, Total: 2.7 g/dL (ref 1.5–4.5)
Glucose: 103 mg/dL — ABNORMAL HIGH (ref 70–99)
Potassium: 4.3 mmol/L (ref 3.5–5.2)
Sodium: 142 mmol/L (ref 134–144)
Total Protein: 6.8 g/dL (ref 6.0–8.5)
eGFR: 80 mL/min/{1.73_m2} (ref >59)

## 2024-02-20 LAB — TSH: TSH: 2.6 u[IU]/mL (ref 0.450–4.500)

## 2024-02-21 ENCOUNTER — Encounter: Payer: Self-pay | Admitting: Family Medicine

## 2024-03-14 DIAGNOSIS — M79675 Pain in left toe(s): Secondary | ICD-10-CM | POA: Diagnosis not present

## 2024-03-14 DIAGNOSIS — M79674 Pain in right toe(s): Secondary | ICD-10-CM | POA: Diagnosis not present

## 2024-03-14 DIAGNOSIS — B351 Tinea unguium: Secondary | ICD-10-CM | POA: Diagnosis not present

## 2024-04-09 ENCOUNTER — Ambulatory Visit: Payer: Medicare HMO

## 2024-04-09 VITALS — BP 160/78 | Ht 64.0 in | Wt 151.0 lb

## 2024-04-09 DIAGNOSIS — Z0000 Encounter for general adult medical examination without abnormal findings: Secondary | ICD-10-CM | POA: Diagnosis not present

## 2024-04-09 NOTE — Progress Notes (Signed)
 Subjective:   [name] is a 83 y.o. who presents for a Medicare Wellness preventive visit.  Visit Complete: Virtual I connected with  [name] on 04/09/24 by a audio enabled telemedicine application and verified that I am speaking with the correct person using two identifiers.  Patient Location: Home  Provider Location: Home Office  I discussed the limitations of evaluation and management by telemedicine. The patient expressed understanding and agreed to proceed.  Vital Signs: Because this visit was a virtual/telehealth visit, some criteria may be missing or patient reported. Any vitals not documented were not able to be obtained and vitals that have been documented are patient reported.  VideoDeclined- This patient declined Librarian, academic. Therefore the visit was completed with audio only.  Persons Participating in Visit: Patient.  AWV Questionnaire: No: Patient Medicare AWV questionnaire was not completed prior to this visit.  Cardiac Risk Factors include: advanced age (>57men, >25 women);dyslipidemia     Objective:    Today's Vitals   04/09/24 1319  BP: (!) 160/78  Weight: 151 lb (68.5 kg)  Height: 5\' 4"  (1.626 m)   Body mass index is 25.92 kg/m.     04/09/2024    1:25 PM 02/05/2024    2:04 PM 11/06/2023    9:33 AM 10/20/2023    1:45 PM 04/05/2023    2:37 PM 08/10/2022    3:40 PM 08/09/2021    4:12 PM  Advanced Directives  Does Patient Have a Medical Advance Directive? Yes Yes Yes Yes Yes Yes No  Type of Estate agent of [place];Living will Healthcare Power of [place];Living will Healthcare Power of [place];Living will Healthcare Power of [place];Living will Out of facility DNR (pink MOST or yellow form);Living will Living will   Does patient want to make changes to medical advance directive?   No - Patient declined No - Patient declined No - Patient declined No - Patient declined    Copy of Healthcare Power of Attorney in Chart? No - copy requested No - copy requested No - copy requested No - copy requested No - copy requested    Would patient like information on creating a medical advance directive?  No - Patient declined No - Patient declined No - Patient declined   No - Patient declined    Current Medications (verified) Outpatient Encounter Medications as of 04/09/2024  Medication Sig   atenolol  (TENORMIN ) 50 MG tablet Take 2 tablets (100 mg total) by mouth daily.   Calcium Carbonate-Vitamin D (OSCAL 500/200 D-3 PO) Take 1 capsule by mouth. Daily at lunchtime   Cholecalciferol (VITAMIN D) 2000 units CAPS Take 1 capsule by mouth daily.   ELDERBERRY PO Take by mouth.   eszopiclone  (LUNESTA ) 2 MG TABS tablet Take 1 tablet (2 mg total) by mouth at bedtime as needed for sleep. Take immediately before bedtime   ezetimibe  (ZETIA ) 10 MG tablet Take 1 tablet (10 mg total) by mouth daily.   Glucosamine-Chondroitin (GLUCOSAMINE CHONDR COMPLEX PO) Take 600 mg by mouth daily.   levothyroxine  (SYNTHROID ) 100 MCG tablet TAKE 1 TABLET BY MOUTH ONCE DAILY BEFORE BREAKFAST   losartan  (COZAAR ) 50 MG tablet Take 1 tablet (50 mg total) by mouth daily.   multivitamin-lutein (OCUVITE-LUTEIN) CAPS Take 1 capsule by mouth daily.   Omega-3 Fatty Acids (FISH OIL) 600 MG CAPS Take 1,200 mg by mouth 2 (two) times daily.   Tavaborole 5 % SOLN Apply 1 Application topically daily.   Turmeric 500 MG CAPS  Take 1 capsule by mouth daily.   No facility-administered encounter medications on file as of 04/09/2024.    Allergies (verified) Penicillins, Statins, Sulfa antibiotics, and Sulfonamide derivatives   History: Past Medical History:  Diagnosis Date   Hyperlipidemia    Hypertension    Thyroid  disease    Past Surgical History:  Procedure Laterality Date   ABDOMINAL HYSTERECTOMY     APPENDECTOMY     tosillectomy     Family History  Problem Relation Age of Onset   Breast cancer Mother     Cancer Mother        breast   Heart disease Mother    Parkinson's disease Father    Social History   Socioeconomic History   Marital status: Widowed    Spouse name: Dean Every   Number of children: 3   Years of education: Not on file   Highest education level: Bachelor's degree (e.g., BA, AB, BS)  Occupational History   Occupation: Retired  Tobacco Use   Smoking status: Never   Smokeless tobacco: Never  Vaping Use   Vaping status: Never Used  Substance and Sexual Activity   Alcohol use: Yes    Alcohol/week: 1.0 standard drink of alcohol    Types: 1 Glasses of wine per week    Comment: occ   Drug use: No   Sexual activity: Not Currently  Other Topics Concern   Not on file  Social History Narrative   Husband, Dean Every, died from Covid in 03-May-2020.   Social Drivers of Corporate investment banker Strain: Low Risk  (04/05/2023)   Overall Financial Resource Strain (CARDIA)    Difficulty of Paying Living Expenses: Not hard at all  Food Insecurity: No Food Insecurity (04/09/2024)   Hunger Vital Sign    Worried About Running Out of Food in the Last Year: Never true    Ran Out of Food in the Last Year: Never true  Transportation Needs: No Transportation Needs (04/09/2024)   PRAPARE - Administrator, Civil Service (Medical): No    Lack of Transportation (Non-Medical): No  Physical Activity: Insufficiently Active (04/09/2024)   Exercise Vital Sign    Days of Exercise per Week: 3 days    Minutes of Exercise per Session: 20 min  Stress: No Stress Concern Present (04/09/2024)   [employer] of Occupational Health - Occupational Stress Questionnaire    Feeling of Stress : Not at all  Social Connections: Moderately Integrated (04/09/2024)   Social Connection and Isolation Panel [NHANES]    Frequency of Communication with Friends and Family: More than three times a week    Frequency of Social Gatherings with Friends and Family: More than three times a week    Attends Religious  Services: More than 4 times per year    Active Member of [employer] or Organizations: No    Attends Engineer, structural: More than 4 times per year    Marital Status: Widowed    Tobacco Counseling Counseling given: Yes    Clinical Intake:  Pre-visit preparation completed: Yes  Pain : No/denies pain     BMI - recorded: 25.92 Nutritional Status: BMI 25 -29 Overweight Nutritional Risks: None Diabetes: No  Lab Results  Component Value Date   HGBA1C 5.8 08/23/2017   HGBA1C 5.6 07/28/2016     How often do you need to have someone help you when you read instructions, pamphlets, or other written materials from your doctor or pharmacy?: 1 - Never  Interpreter  Needed?: No  Information entered by :: Alia T/cma   Activities of Daily Living     04/09/2024    1:23 PM  In your present state of health, do you have any difficulty performing the following activities:  Hearing? 0  Vision? 0  Comment pt goes to Dr. Candi Chafe in [place]  Difficulty concentrating or making decisions? 0  Walking or climbing stairs? 0  Dressing or bathing? 0  Doing errands, shopping? 0  Preparing Food and eating ? N  Using the Toilet? N  In the past six months, have you accidently leaked urine? N  Do you have problems with loss of bowel control? N  Managing your Medications? N  Managing your Finances? N  Housekeeping or managing your Housekeeping? N    Patient Care Team: Dettinger, Lucio Sabin, MD as PCP - General (Family Medicine) [facility] Lorry Rouge, MD as Referring Physician (Dermatology)  Indicate any recent Medical Services you may have received from other than [facility] providers in the past year (date may be approximate).     Assessment:   This is a routine wellness examination for [place].  Hearing/Vision screen No results found.   Goals Addressed             This Visit's Progress    DIET - EAT MORE FRUITS AND VEGETABLES   On track    DIET -  REDUCE SODIUM INTAKE   On track    Pt does not snack often but when she does it is usually salty snacks. She would like to not eat salty and switch to a healthier choice.       Depression Screen     04/09/2024    1:22 PM 02/19/2024    8:11 AM 08/21/2023    4:32 PM 04/05/2023    2:36 PM 02/08/2023    8:17 AM 08/10/2022    3:46 PM 08/08/2022    8:31 AM  PHQ 2/9 Scores  PHQ - 2 Score 0 0 0 0 0 0 0  PHQ- 9 Score    0 0      Fall Risk     04/09/2024    1:22 PM 02/19/2024    8:11 AM 08/21/2023    4:32 PM 04/05/2023    2:37 PM 02/08/2023    8:17 AM  Fall Risk   Falls in the past year? 0 0 0 0 0  Number falls in past yr: 0   0   Injury with Fall? 0   0   Risk for fall due to : No Fall Risks   No Fall Risks   Follow up Falls prevention discussed;Falls evaluation completed   Falls prevention discussed;Education provided;Falls evaluation completed     MEDICARE RISK AT HOME:  Medicare Risk at Home Any stairs in or around the home?: Yes If so, are there any without handrails?: Yes Home free of loose throw rugs in walkways, pet beds, electrical cords, etc?: Yes Adequate lighting in your home to reduce risk of falls?: Yes Life alert?: No Use of a cane, walker or w/c?: No Grab bars in the bathroom?: Yes Shower chair or bench in shower?: No Elevated toilet seat or a handicapped toilet?: Yes  TIMED UP AND GO:  Was the test performed?    Cognitive Function: 6CIT completed        04/09/2024    1:32 PM 04/05/2023    2:37 PM 08/10/2022    3:43 PM 08/30/2019    1:43 PM  6CIT Screen  What Year? 0 points 0 points 0 points 0 points  What month? 0 points 0 points 0 points 0 points  What time? 0 points 0 points 0 points 0 points  Count back from 20 0 points 0 points 0 points 0 points  Months in reverse 0 points 0 points 0 points 0 points  Repeat phrase 0 points 0 points 0 points 0 points  Total Score 0 points 0 points 0 points 0 points    Immunizations Immunization History  Administered  Date(s) Administered   Fluad Quad(high Dose 65+) 11/17/2021   Moderna Sars-Covid-2 Vaccination 01/23/2020, 02/21/2020   PFIZER(Purple Top)SARS-COV-2 Vaccination 06/17/2021   PNEUMOCOCCAL CONJUGATE-20 02/07/2022   Pneumococcal Conjugate-13 06/18/2015   Pneumococcal-Unspecified 05/12/2009   Td 05/18/2016   Tetanus 01/12/2005   Unspecified SARS-COV-2 Vaccination 12/08/2021   Zoster Recombinant(Shingrix) 05/05/2021, 08/05/2021   Zoster, Live 05/25/2010    Screening Tests Health Maintenance  Topic Date Due   COVID-19 Vaccine (5 - 2024-25 season) 04/25/2025 (Originally 08/13/2023)   INFLUENZA VACCINE  07/12/2024   MAMMOGRAM  02/06/2025   Medicare Annual Wellness (AWV)  04/09/2025   DEXA SCAN  04/16/2025   DTaP/Tdap/Td (2 - Tdap) 05/18/2026   Pneumonia Vaccine 71+ Years old  Completed   Zoster Vaccines- Shingrix  Completed   HPV VACCINES  Aged Out   Meningococcal B Vaccine  Aged Out    Health Maintenance  There are no preventive care reminders to display for this patient.  Health Maintenance Items Addressed: See Nurse Notes  Additional Screening:  Vision Screening: Recommended annual ophthalmology exams for early detection of glaucoma and other disorders of the eye.  Dental Screening: Recommended annual dental exams for proper oral hygiene  Community Resource Referral / Chronic Care Management: CRR required this visit?  No   CCM required this visit?  No     Plan:     I have personally reviewed and noted the following in the patient's chart:   Medical and social history Use of alcohol, tobacco or illicit drugs  Current medications and supplements including opioid prescriptions. Patient is not currently taking opioid prescriptions. Functional ability and status Nutritional status Physical activity Advanced directives List of other physicians Hospitalizations, surgeries, and ER visits in previous 12 months Vitals Screenings to include cognitive, depression, and  falls Referrals and appointments  In addition, I have reviewed and discussed with patient certain preventive protocols, quality metrics, and best practice recommendations. A written personalized care plan for preventive services as well as general preventive health recommendations were provided to patient.     Michaelle Adolphus, CMA   04/09/2024   After Visit Summary: (MyChart) Due to this being a telephonic visit, the after visit summary with patients personalized plan was offered to patient via MyChart   Notes: Nothing significant to report at this time.

## 2024-04-09 NOTE — Patient Instructions (Signed)
 [name] , Thank you for taking time to come for your Medicare Wellness Visit. I appreciate your ongoing commitment to your health goals. Please review the following plan we discussed and let me know if I can assist you in the future.   Referrals/Orders/Follow-Ups/Clinician Recommendations: n/a  This is a list of the screening recommended for you and due dates:  Health Maintenance  Topic Date Due   COVID-19 Vaccine (5 - 2024-25 season) 04/25/2025*   Flu Shot  07/12/2024   Mammogram  02/06/2025   Medicare Annual Wellness Visit  04/09/2025   DEXA scan (bone density measurement)  04/16/2025   DTaP/Tdap/Td vaccine (2 - Tdap) 05/18/2026   Pneumonia Vaccine  Completed   Zoster (Shingles) Vaccine  Completed   HPV Vaccine  Aged Out   Meningitis B Vaccine  Aged Out  *Topic was postponed. The date shown is not the original due date.    Advanced directives: (Copy Requested) Please bring a copy of your health care power of attorney and living will to the office to be added to your chart at your convenience. You can mail to [facility] [address] [place], [place] [address] or email to [email or web address]  Next Medicare Annual Wellness Visit scheduled for next year: Yes

## 2024-07-24 ENCOUNTER — Telehealth: Payer: Self-pay

## 2024-07-24 DIAGNOSIS — C9110 Chronic lymphocytic leukemia of B-cell type not having achieved remission: Secondary | ICD-10-CM

## 2024-07-24 NOTE — Telephone Encounter (Signed)
 Future orders have been placed by Dr. Davonna with Oncology. However, the resulting aging is not [facility]. Our office requires that labs have a resulting agency of [facility].   Reordered Dr. Armanda future labs and changed resulting agency to [facility].  Pt is aware of lab appt 07/25/24 at 11am.  Pt has a f/u with Dr. Maryanne on 08/21/24.

## 2024-07-24 NOTE — Addendum Note (Signed)
 Addended by: LEIGH ROSINA SAILOR on: 07/24/2024 11:37 AM   Modules accepted: Orders

## 2024-07-24 NOTE — Telephone Encounter (Signed)
 Copied from CRM [id number]. Topic: Clinical - Request for Lab/Test Order >> Jul 24, 2024 10:51 AM Rosaria BRAVO wrote: Reason for CRM: Pt called scheduling a lab appt from oncology. Is missing the orders, she is asking that someone from the clinic contacts Oncology.   Dr. Davonna

## 2024-07-25 ENCOUNTER — Other Ambulatory Visit

## 2024-07-25 DIAGNOSIS — C9110 Chronic lymphocytic leukemia of B-cell type not having achieved remission: Secondary | ICD-10-CM

## 2024-07-26 LAB — CMP14+EGFR
ALT: 16 IU/L (ref 0–32)
AST: 18 IU/L (ref 0–40)
Albumin: 4.1 g/dL (ref 3.7–4.7)
Alkaline Phosphatase: 63 IU/L (ref 44–121)
BUN/Creatinine Ratio: 26 (ref 12–28)
BUN: 18 mg/dL (ref 8–27)
Bilirubin Total: 0.5 mg/dL (ref 0.0–1.2)
CO2: 23 mmol/L (ref 20–29)
Calcium: 9.4 mg/dL (ref 8.7–10.3)
Chloride: 101 mmol/L (ref 96–106)
Creatinine, Ser: 0.69 mg/dL (ref 0.57–1.00)
Globulin, Total: 2.4 g/dL (ref 1.5–4.5)
Glucose: 90 mg/dL (ref 70–99)
Potassium: 4.3 mmol/L (ref 3.5–5.2)
Sodium: 138 mmol/L (ref 134–144)
Total Protein: 6.5 g/dL (ref 6.0–8.5)
eGFR: 86 mL/min/1.73 (ref >59)

## 2024-07-26 LAB — LACTATE DEHYDROGENASE: LDH: 175 IU/L (ref 119–226)

## 2024-07-26 LAB — CBC WITH DIFFERENTIAL/PLATELET
Basophils Absolute: 0.1 x10E3/uL (ref 0.0–0.2)
Basos: 1 %
EOS (ABSOLUTE): 0.4 x10E3/uL (ref 0.0–0.4)
Eos: 2 %
Hematocrit: 41.5 % (ref 34.0–46.6)
Hemoglobin: 13.2 g/dL (ref 11.1–15.9)
Immature Grans (Abs): 0 x10E3/uL (ref 0.0–0.1)
Immature Granulocytes: 0 %
Lymphocytes Absolute: 14.4 x10E3/uL — ABNORMAL HIGH (ref 0.7–3.1)
Lymphs: 64 %
MCH: 29.8 pg (ref 26.6–33.0)
MCHC: 31.8 g/dL (ref 31.5–35.7)
MCV: 94 fL (ref 79–97)
Monocytes Absolute: 1.7 x10E3/uL — ABNORMAL HIGH (ref 0.1–0.9)
Monocytes: 8 %
Neutrophils Absolute: 5.5 x10E3/uL (ref 1.4–7.0)
Neutrophils: 25 %
Platelets: 230 x10E3/uL (ref 150–450)
RBC: 4.43 x10E6/uL (ref 3.77–5.28)
RDW: 12.6 % (ref 11.7–15.4)
WBC: 22.1 x10E3/uL (ref 3.4–10.8)

## 2024-07-26 LAB — URIC ACID: Uric Acid: 3.3 mg/dL (ref 3.1–7.9)

## 2024-08-04 ENCOUNTER — Other Ambulatory Visit: Payer: Self-pay | Admitting: Family Medicine

## 2024-08-04 DIAGNOSIS — E039 Hypothyroidism, unspecified: Secondary | ICD-10-CM

## 2024-08-04 NOTE — Progress Notes (Signed)
 [facility] at [facility]  HEMATOLOGY FOLLOW-UP VISIT  Dettinger, Fonda LABOR, MD  REASON FOR FOLLOW-UP: CLL  ASSESSMENT & PLAN:  Patient is an 83 year old female with asymptomatic CLL   Assessment & Plan CLL (chronic lymphocytic leukemia) (HCC) Newly diagnosed with flow cytometry. RAI stage: 0  CLL FISH consistent with trisomy 12.  No current indication for treatment.  Discussed the importance of regular monitoring and potential symptoms of disease progression.  Slightly uptrending lymphocyte count.  Patient is asymptomatic.  -Advise patient to report symptoms such as night sweats, fever, chills, weight loss, abdominal pain, or loss of appetite. -Patient would like to visit her son in [place].  Will prefer next follow-up in 6 months.  RTC in 6 months with labs    No orders of the defined types were placed in this encounter.   The total time spent in the appointment was *** minutes encounter with patients including review of chart and various tests results, discussions about plan of care and coordination of care plan   All questions were answered. The patient knows to call the clinic with any problems, questions or concerns. No barriers to learning was detected.   LILLETTE Verneta SAUNDERS Teague,acting as a Neurosurgeon for Mickiel Dry, MD.,have documented all relevant documentation on the behalf of Mickiel Dry, MD,as directed by  Mickiel Dry, MD while in the presence of Mickiel Dry, MD.  I, Mickiel Dry MD, have reviewed the above documentation for accuracy and completeness, and I agree with the above.    Mickiel Dry, MD 8/24/202510:06 PM   SUMMARY OF HEMATOLOGIC HISTORY: -10/20/2023: Flow cytometry: Consistent with CLL -CLL FISH: Trisomy 12. Results for  CCND1/IGH, ATM, 13q and TP53 were normal.   INTERVAL HISTORY: [name] 83 y.o. female following for asymptomatic CLL.  She is accompanied by her friend today.   I have reviewed  the past medical history, past surgical history, social history and family history with the patient   ALLERGIES:  is allergic to penicillins, statins, sulfa antibiotics, and sulfonamide derivatives.  MEDICATIONS:  Current Outpatient Medications  Medication Sig Dispense Refill   atenolol  (TENORMIN ) 50 MG tablet Take 2 tablets (100 mg total) by mouth daily. 180 tablet 3   Calcium Carbonate-Vitamin D (OSCAL 500/200 D-3 PO) Take 1 capsule by mouth. Daily at lunchtime     Cholecalciferol (VITAMIN D) 2000 units CAPS Take 1 capsule by mouth daily.     ELDERBERRY PO Take by mouth.     eszopiclone  (LUNESTA ) 2 MG TABS tablet Take 1 tablet (2 mg total) by mouth at bedtime as needed for sleep. Take immediately before bedtime 30 tablet 5   ezetimibe  (ZETIA ) 10 MG tablet Take 1 tablet (10 mg total) by mouth daily. 90 tablet 3   Glucosamine-Chondroitin (GLUCOSAMINE CHONDR COMPLEX PO) Take 600 mg by mouth daily.     levothyroxine  (SYNTHROID ) 100 MCG tablet TAKE 1 TABLET BY MOUTH ONCE DAILY BEFORE BREAKFAST 90 tablet 2   losartan  (COZAAR ) 50 MG tablet Take 1 tablet (50 mg total) by mouth daily. 90 tablet 3   multivitamin-lutein (OCUVITE-LUTEIN) CAPS Take 1 capsule by mouth daily.     Omega-3 Fatty Acids (FISH OIL) 600 MG CAPS Take 1,200 mg by mouth 2 (two) times daily.     Tavaborole 5 % SOLN Apply 1 Application topically daily.     Turmeric 500 MG CAPS Take 1 capsule by mouth daily.     No current facility-administered medications for this visit.  REVIEW OF SYSTEMS:   Constitutional: Denies fevers, chills or night sweats Eyes: Denies blurriness of vision Ears, nose, mouth, throat, and face: Denies mucositis or sore throat Respiratory: Denies cough, dyspnea or wheezes Cardiovascular: Denies palpitation, chest discomfort or lower extremity swelling Gastrointestinal:  Denies nausea, heartburn or change in bowel habits Skin: Denies abnormal skin rashes Lymphatics: Denies new lymphadenopathy or easy  bruising Neurological:Denies numbness, tingling or new weaknesses Behavioral/Psych: Mood is stable, no new changes  All other systems were reviewed with the patient and are negative.  PHYSICAL EXAMINATION:   There were no vitals filed for this visit.   GENERAL:alert, no distress and comfortable LYMPH:  no palpable lymphadenopathy in the cervical, axillary or inguinal LUNGS: clear to auscultation and percussion with normal breathing effort HEART: regular rate & rhythm and no murmurs and no lower extremity edema ABDOMEN:abdomen soft, non-tender and normal bowel sounds Musculoskeletal:no cyanosis of digits and no clubbing  NEURO: alert & oriented x 3 with fluent speech  LABORATORY DATA:  I have reviewed the data as listed   Latest Reference Range & Units 08/21/23 16:53 09/04/23 10:20 10/04/23 09:56 10/20/23 14:18 10/24/23 10:44 01/23/24 09:56  WBC 3.4 - 10.8 x10E3/uL 21.7 (HH) 21.4 (HH) 19.8 (H) 20.4 (H) 20.2 (H) 22.7 (HH)  RBC 3.77 - 5.28 x10E6/uL 5.01 4.68 4.79 4.72 4.82 4.93  Hemoglobin 11.1 - 15.9 g/dL 84.7 85.5 85.0 85.0 85.1 15.0  HCT 34.0 - 46.6 % 45.7 43.0 44.5 44.5 45.8 45.9  MCV 79 - 97 fL 91 92 93 94.3 95.0 93  MCH 26.6 - 33.0 pg 30.3 30.8 31.1 31.6 30.7 30.4  MCHC 31.5 - 35.7 g/dL 66.6 66.4 66.4 66.4 67.6 32.7  RDW 11.7 - 15.4 % 12.4 12.1 12.6 12.8 12.9 12.1  Platelets 150 - 450 x10E3/uL 271 252 260 268 248 283  MPV 0 - 99.8 fL        nRBC 0.0 - 0.2 %    0.0 0.0   Hematology Comments:       Note:  Neutrophils Not Estab. % 27 22 24 28 21 21   Lymphocytes %    59 71   Monocytes Relative %    11 2   Eosinophil %    2 6   Basophil %    0 0   Immature Granulocytes Not Estab. % 0 0 0   0  NEUT# 1.4 - 7.0 x10E3/uL 5.9 4.8 4.8 5.7 4.2 4.7  Lymphs Abs 0.7 - 3.1 x10E3/uL 13.4 (H) 15.4 (H) 13.0 (H) 12.0 (H) 14.3 (H) 16.7 (H)  Monocyte # 0.1 - 1.0 K/uL    2.2 (H) 0.4   Monocytes Absolute 0.1 - 0.9 x10E3/uL 1.9 (H) 0.9 1.7 (H)   0.9  Eosinophils Absolute 0.0 - 0.5 K/uL    0.4  1.2 (H)   Basophils Absolute 0.0 - 0.2 x10E3/uL 0.1 0.1 0.1 0.0 0.0 0.1  Abs Immature Granulocytes 0.00 - 0.07 K/uL    0.00 0.00   Immature Grans (Abs) 0.0 - 0.1 x10E3/uL 0.0 0.0 0.0   0.0  Lymphs Not Estab. % 61 73 64   73  Monocytes Not Estab. % 9 4 9   4   Basos Not Estab. % 1 0 1   1  (HH): Data is critically high (H): Data is abnormally high       Chemistry      Component Value Date/Time   NA 138 07/25/2024 1059   K 4.3 07/25/2024 1059   CL 101 07/25/2024 1059  CO2 23 07/25/2024 1059   BUN 18 07/25/2024 1059   CREATININE 0.69 07/25/2024 1059   CREATININE 0.68 07/04/2013 1105      Component Value Date/Time   CALCIUM 9.4 07/25/2024 1059   ALKPHOS 63 07/25/2024 1059   AST 18 07/25/2024 1059   ALT 16 07/25/2024 1059   BILITOT 0.5 07/25/2024 1059      Latest Reference Range & Units 10/20/23 14:18 01/23/24 09:56  LDH 119 - 226 IU/L 150 216    Latest Reference Range & Units 10/20/23 14:18  Uric Acid, Serum 2.5 - 7.1 mg/dL 3.8

## 2024-08-04 NOTE — Assessment & Plan Note (Addendum)
 Newly diagnosed with flow cytometry. RAI stage: 0  CLL FISH consistent with trisomy 12.  No current indication for treatment.  Discussed the importance of regular monitoring and potential symptoms of disease progression.  Slightly uptrending lymphocyte count.  Patient is asymptomatic.  -Advise patient to report symptoms such as night sweats, fever, chills, weight loss, abdominal pain, or loss of appetite. -Patient would like to visit her son in [place].  Will prefer next follow-up in 6 months.  RTC in 6 months with labs

## 2024-08-05 ENCOUNTER — Inpatient Hospital Stay: Payer: Medicare HMO | Attending: Oncology | Admitting: Oncology

## 2024-08-05 VITALS — BP 177/77 | HR 81 | Temp 98.1°F | Resp 18 | Wt 144.0 lb

## 2024-08-05 DIAGNOSIS — C9110 Chronic lymphocytic leukemia of B-cell type not having achieved remission: Secondary | ICD-10-CM | POA: Insufficient documentation

## 2024-08-05 DIAGNOSIS — M1990 Unspecified osteoarthritis, unspecified site: Secondary | ICD-10-CM | POA: Diagnosis not present

## 2024-08-05 MED ORDER — NAPROXEN 250 MG PO TABS: 250.0000 mg | ORAL_TABLET | Freq: Two times a day (BID) | ORAL | 2 refills | Status: DC

## 2024-08-05 NOTE — Patient Instructions (Signed)
 [facility] at [facility] Discharge Instructions   You were seen and examined today by Dr. Davonna.  She reviewed the results of your lab work which are normal/stable.   We will see you back in 6 months. We will repeat lab work prior to this visit.   Return as scheduled.    Thank you for choosing  Cancer Center at [facility] to provide your oncology and hematology care.  To afford each patient quality time with our provider, please arrive at least 15 minutes before your scheduled appointment time.   If you have a lab appointment with the Cancer Center please come in thru the Main Entrance and check in at the main information desk.  You need to re-schedule your appointment should you arrive 10 or more minutes late.  We strive to give you quality time with our providers, and arriving late affects you and other patients whose appointments are after yours.  Also, if you no show three or more times for appointments you may be dismissed from the clinic at the providers discretion.     Again, thank you for choosing [facility].  Our hope is that these requests will decrease the amount of time that you wait before being seen by our physicians.       _____________________________________________________________  Should you have questions after your visit to [facility], please contact our office at [phone] and follow the prompts.  Our office hours are 8:00 a.m. and 4:30 p.m. Monday - Friday.  Please note that voicemails left after 4:00 p.m. may not be returned until the following business day.  We are closed weekends and major holidays.  You do have access to a nurse 24-7, just call the main number to the clinic [phone] and do not press any options, hold on the line and a nurse will answer the phone.    For prescription refill requests, have your pharmacy contact our office and allow 72 hours.    Due to Covid, you will  need to wear a mask upon entering the hospital. If you do not have a mask, a mask will be given to you at the Main Entrance upon arrival. For doctor visits, patients may have 1 support person age 4 or older with them. For treatment visits, patients can not have anyone with them due to social distancing guidelines and our immunocompromised population.

## 2024-08-05 NOTE — Assessment & Plan Note (Addendum)
 Patient continues to reports bilateral knee pain that gets better through the day and she thinks it is arthritis  -Will prescribe Naprosyn  250 mg to be taken twice daily. - Recommended patient to follow-up with primary care and get prescriptions from there if needed-results are only every 6 months

## 2024-08-21 ENCOUNTER — Ambulatory Visit (INDEPENDENT_AMBULATORY_CARE_PROVIDER_SITE_OTHER): Admitting: Family Medicine

## 2024-08-21 ENCOUNTER — Encounter: Payer: Self-pay | Admitting: Family Medicine

## 2024-08-21 VITALS — BP 177/80 | HR 67 | Temp 97.4°F | Ht 64.0 in | Wt 147.0 lb

## 2024-08-21 DIAGNOSIS — E039 Hypothyroidism, unspecified: Secondary | ICD-10-CM | POA: Diagnosis not present

## 2024-08-21 DIAGNOSIS — I10 Essential (primary) hypertension: Secondary | ICD-10-CM | POA: Diagnosis not present

## 2024-08-21 DIAGNOSIS — F5104 Psychophysiologic insomnia: Secondary | ICD-10-CM

## 2024-08-21 DIAGNOSIS — M170 Bilateral primary osteoarthritis of knee: Secondary | ICD-10-CM

## 2024-08-21 DIAGNOSIS — E782 Mixed hyperlipidemia: Secondary | ICD-10-CM

## 2024-08-21 DIAGNOSIS — L570 Actinic keratosis: Secondary | ICD-10-CM | POA: Diagnosis not present

## 2024-08-21 DIAGNOSIS — Z85828 Personal history of other malignant neoplasm of skin: Secondary | ICD-10-CM | POA: Diagnosis not present

## 2024-08-21 DIAGNOSIS — L218 Other seborrheic dermatitis: Secondary | ICD-10-CM | POA: Diagnosis not present

## 2024-08-21 MED ORDER — NAPROXEN 250 MG PO TABS: 250.0000 mg | ORAL_TABLET | Freq: Two times a day (BID) | ORAL | 5 refills | Status: DC

## 2024-08-21 MED ORDER — ESZOPICLONE 2 MG PO TABS: 2.0000 mg | ORAL_TABLET | Freq: Every evening | ORAL | 5 refills | Status: AC | PRN

## 2024-08-21 NOTE — Progress Notes (Signed)
 BP (!) 177/80   Pulse 67   Temp (!) 97.4 F (36.3 C)   Ht 5' 4 (1.626 m)   Wt 147 lb (66.7 kg)   SpO2 95%   BMI 25.23 kg/m    Subjective:   Patient ID: [name], female    DOB: Dec 16, 1940, 83 y.o.   MRN: [id number]  HPI: [name] is a 83 y.o. female presenting on 08/21/2024 for Medical Management of Chronic Issues, Hypothyroidism, Hypertension, and Hyperlipidemia   Discussed the use of AI scribe software for clinical note transcription with the patient, who gave verbal consent to proceed.  History of Present Illness   [name] is an 83 year old female who presents for a recheck of her chronic medical issues.  She experiences insomnia and uses Lunesta  2 mg as needed at bedtime. She finds it effective and does not use it every night. Her refills have been consistent, with no issues noted in the [place]  controlled substance database.  She reports new-onset arthritis in her knees, which began during a trip to [place] with her son. She experienced significant pain, alleviated by ibuprofen provided by her son. Upon returning home, she was prescribed Naprosyn  250 mg, which has been helpful. She uses a pedal machine for 30 minutes daily, which she finds beneficial. She continues to play the piano at church but experiences stiffness and pain when standing after sitting for long periods.  Her blood pressure readings at home have been consistently in the 120s/60s range.  For her hypothyroidism, she continues to take levothyroxine  without any issues. Regarding her hyperlipidemia, she is on Zetia  and fish oils, and her cholesterol levels are being monitored.  She mentions a change in her hair texture, becoming oily since her leukemia diagnosis. She is not currently undergoing treatment for leukemia as it is stable.          Relevant past medical, surgical, family and social history reviewed and updated as indicated. Interim medical history  since our last visit reviewed. Allergies and medications reviewed and updated.  Review of Systems  Constitutional:  Negative for chills and fever.  Eyes:  Negative for redness and visual disturbance.  Respiratory:  Negative for chest tightness and shortness of breath.   Cardiovascular:  Negative for chest pain and leg swelling.  Musculoskeletal:  Negative for back pain and gait problem.  Skin:  Negative for rash.  Neurological:  Negative for light-headedness and headaches.  Psychiatric/Behavioral:  Positive for sleep disturbance. Negative for agitation and behavioral problems. The patient is not nervous/anxious.   All other systems reviewed and are negative.   Per HPI unless specifically indicated above   Allergies as of 08/21/2024       Reactions   Penicillins    Statins    Muscle problems Muscle problems   Sulfa Antibiotics    Sulfonamide Derivatives         Medication List        Accurate as of August 21, 2024  8:54 AM. If you have any questions, ask your nurse or doctor.          atenolol  50 MG tablet Commonly known as: TENORMIN  Take 2 tablets (100 mg total) by mouth daily.   ELDERBERRY PO Take by mouth.   eszopiclone  2 MG Tabs tablet Commonly known as: Lunesta  Take 1 tablet (2 mg total) by mouth at bedtime as needed for sleep. Take immediately before bedtime   ezetimibe  10 MG tablet Commonly known as: ZETIA  Take  1 tablet (10 mg total) by mouth daily.   Fish Oil 600 MG Caps Take 1,200 mg by mouth 2 (two) times daily.   GLUCOSAMINE CHONDR COMPLEX PO Take 600 mg by mouth daily.   levothyroxine  100 MCG tablet Commonly known as: SYNTHROID  TAKE 1 TABLET BY MOUTH ONCE DAILY BEFORE BREAKFAST   losartan  50 MG tablet Commonly known as: COZAAR  Take 1 tablet (50 mg total) by mouth daily.   multivitamin-lutein Caps capsule Take 1 capsule by mouth daily.   naproxen  250 MG tablet Commonly known as: NAPROSYN  Take 1 tablet (250 mg total) by mouth 2  (two) times daily with a meal.   OSCAL 500/200 D-3 PO Take 1 capsule by mouth. Daily at lunchtime   Tavaborole 5 % Soln Apply 1 Application topically daily.   Turmeric 500 MG Caps Take 1 capsule by mouth daily.   Vitamin D 50 MCG (2000 UT) Caps Take 1 capsule by mouth daily.         Objective:   BP (!) 177/80   Pulse 67   Temp (!) 97.4 F (36.3 C)   Ht 5' 4 (1.626 m)   Wt 147 lb (66.7 kg)   SpO2 95%   BMI 25.23 kg/m   Wt Readings from Last 3 Encounters:  08/21/24 147 lb (66.7 kg)  08/05/24 144 lb (65.3 kg)  04/09/24 151 lb (68.5 kg)    Physical Exam Physical Exam   NECK: Thyroid  normal, no nodules. CHEST: Lungs clear to auscultation bilaterally. CARDIOVASCULAR: Regular rate and rhythm, no murmurs. EXTREMITIES: Mild swelling in extremities.         Assessment & Plan:   Problem List Items Addressed This Visit       Cardiovascular and Mediastinum   Essential hypertension     Endocrine   Hypothyroidism - Primary   Relevant Orders   TSH     Other   Hyperlipidemia   Relevant Orders   Lipid panel   Insomnia   Relevant Medications   eszopiclone  (LUNESTA ) 2 MG TABS tablet   Other Relevant Orders   ToxASSURE Select 13 (MW), Urine   Other Visit Diagnoses       Bilateral primary osteoarthritis of knee       Relevant Medications   naproxen  (NAPROSYN ) 250 MG tablet          Essential hypertension Blood pressure elevated during visit, but home readings consistently in the 120s/60s, indicating good control.  Bilateral knee osteoarthritis Reports more pain in one knee. Naproxen  250 mg prescribed by oncologist has been effective. Engages in regular physical activity, including pedaling and Zumba, which helps manage symptoms. - Continue naproxen  250 mg as needed, up to twice daily, with food. - Use Voltaren gel topically as first-line treatment. - Consider knee injection if pain worsens. - Continue regular physical activity, including pedaling and  Zumba.  Hypothyroidism Well-managed on levothyroxine  with no reported issues. Thyroid  examination normal with no lumps or nodules.  Mixed hyperlipidemia On Zetia  and fish oils for cholesterol management. - Recheck cholesterol levels.  Insomnia Uses Lunesta  2 mg as needed, which is effective. Insurance does not cover Lunesta , but uses GoodRx for cost reduction.  Chronic lymphocytic leukemia, not currently treated Leukemia stable with no current treatment. Reports oily hair, possibly related to leukemia or its treatment.  Lower extremity edema Mild edema present. - Elevate feet at night. - Continue pedaling exercise.  Follow-Up Annual controlled substance agreement renewal is due. - Renew controlled substance agreement.  Follow up plan: Return in about 6 months (around 02/18/2025), or if symptoms worsen or fail to improve, for Thyroid  and hypertension recheck.  Counseling provided for all of the vaccine components Orders Placed This Encounter  Procedures   Lipid panel   TSH   ToxASSURE Select 13 (MW), Urine    Fonda Levins, MD [facility] 08/21/2024, 8:54 AM

## 2024-08-22 DIAGNOSIS — M79675 Pain in left toe(s): Secondary | ICD-10-CM | POA: Diagnosis not present

## 2024-08-22 DIAGNOSIS — B351 Tinea unguium: Secondary | ICD-10-CM | POA: Diagnosis not present

## 2024-08-22 DIAGNOSIS — M79674 Pain in right toe(s): Secondary | ICD-10-CM | POA: Diagnosis not present

## 2024-08-22 LAB — LIPID PANEL
Chol/HDL Ratio: 3.9 ratio (ref 0.0–4.4)
Cholesterol, Total: 174 mg/dL (ref 100–199)
HDL: 45 mg/dL (ref >39)
LDL Chol Calc (NIH): 107 mg/dL — ABNORMAL HIGH (ref 0–99)
Triglycerides: 125 mg/dL (ref 0–149)
VLDL Cholesterol Cal: 22 mg/dL (ref 5–40)

## 2024-08-22 LAB — TSH: TSH: 2.84 u[IU]/mL (ref 0.450–4.500)

## 2024-08-24 LAB — TOXASSURE SELECT 13 (MW), URINE

## 2024-08-28 ENCOUNTER — Other Ambulatory Visit (HOSPITAL_COMMUNITY): Payer: Self-pay

## 2024-08-28 ENCOUNTER — Ambulatory Visit: Payer: Self-pay | Admitting: Family Medicine

## 2024-09-26 NOTE — Progress Notes (Signed)
 [name]                                          MRN: [id number]   09/26/2024   The [facility]BC[facility] Quality Team Specialist reviewed this patient medical record for the purposes of chart review for care gap closure. The following were reviewed: chart review for care gap closure-controlling blood pressure.    VBCI Quality Team

## 2024-11-14 DIAGNOSIS — B351 Tinea unguium: Secondary | ICD-10-CM | POA: Diagnosis not present

## 2024-11-14 DIAGNOSIS — M79675 Pain in left toe(s): Secondary | ICD-10-CM | POA: Diagnosis not present

## 2024-11-14 DIAGNOSIS — M79674 Pain in right toe(s): Secondary | ICD-10-CM | POA: Diagnosis not present

## 2024-12-18 ENCOUNTER — Encounter: Payer: Self-pay | Admitting: Family Medicine

## 2024-12-18 ENCOUNTER — Ambulatory Visit: Admitting: Family Medicine

## 2024-12-18 VITALS — BP 127/75 | HR 93 | Ht 64.0 in | Wt 150.0 lb

## 2024-12-18 DIAGNOSIS — M170 Bilateral primary osteoarthritis of knee: Secondary | ICD-10-CM

## 2024-12-18 DIAGNOSIS — S51012A Laceration without foreign body of left elbow, initial encounter: Secondary | ICD-10-CM

## 2024-12-18 MED ORDER — NAPROXEN 250 MG PO TABS: 250.0000 mg | ORAL_TABLET | Freq: Two times a day (BID) | ORAL | 5 refills | Status: AC

## 2024-12-18 NOTE — Progress Notes (Signed)
 "  BP 127/75   Pulse 93   Ht 5' 4 (1.626 m)   Wt 150 lb (68 kg)   SpO2 97%   BMI 25.75 kg/m    Subjective:   Patient ID: [name], female    DOB: October 21, 1941, 84 y.o.   MRN: [id number]  HPI: [name] is a 84 y.o. female presenting on 12/18/2024 for Knee Pain (Bilateral. Thinks arthritis/) and left arm abrasion (After fall. Wants checked.)   Discussed the use of AI scribe software for clinical note transcription with the patient, who gave verbal consent to proceed.  History of Present Illness   [name] is an 84 year old female who presents with a scraped arm after a fall.  Traumatic arm wound - Sustained a scraped arm four days ago after falling while chasing a roach in the bathroom - Wound has been draining blood since the incident - Applied a bandage at home but lacked first aid supplies - Did not seek immediate medical attention due to travel plans and attending church the following morning - Wound remains open and draining  Arthralgia and analgesic use - History of arthritis - Uses Naprosyn  for symptom management; finds it effective - Uncertain about duration of Naprosyn  use - Takes Naprosyn  with food to avoid gastrointestinal side effects - Occasionally alternates with Tylenol - Monitors for signs of gastrointestinal bleeding  Leukemia surveillance - History of leukemia, currently not requiring treatment - Plans to have blood checked in about a month - Will follow up with oncology  Elevated blood pressure - Noticed an increase in blood pressure since arm injury - Attributes elevated blood pressure to pain from the wound - Monitors blood pressure and notes it has been slightly elevated since the incident          Relevant past medical, surgical, family and social history reviewed and updated as indicated. Interim medical history since our last visit reviewed. Allergies and medications reviewed and updated.  Review of  Systems  Constitutional:  Negative for chills and fever.  Eyes:  Negative for redness and visual disturbance.  Respiratory:  Negative for chest tightness and shortness of breath.   Cardiovascular:  Negative for chest pain and leg swelling.  Genitourinary:  Negative for difficulty urinating and dysuria.  Musculoskeletal:  Positive for arthralgias. Negative for back pain and gait problem.  Skin:  Positive for wound. Negative for color change and rash.  Neurological:  Negative for light-headedness and headaches.  Psychiatric/Behavioral:  Negative for agitation and behavioral problems.   All other systems reviewed and are negative.   Per HPI unless specifically indicated above   Allergies as of 12/18/2024       Reactions   Penicillins    Statins    Muscle problems Muscle problems   Sulfa Antibiotics    Sulfonamide Derivatives         Medication List        Accurate as of December 18, 2024 11:20 AM. If you have any questions, ask your nurse or doctor.          atenolol  50 MG tablet Commonly known as: TENORMIN  Take 2 tablets (100 mg total) by mouth daily.   ELDERBERRY PO Take by mouth.   eszopiclone  2 MG Tabs tablet Commonly known as: Lunesta  Take 1 tablet (2 mg total) by mouth at bedtime as needed for sleep. Take immediately before bedtime   ezetimibe  10 MG tablet Commonly known as: ZETIA  Take 1 tablet (10 mg total) by  mouth daily.   Fish Oil 600 MG Caps Take 1,200 mg by mouth 2 (two) times daily.   GLUCOSAMINE CHONDR COMPLEX PO Take 600 mg by mouth daily.   levothyroxine  100 MCG tablet Commonly known as: SYNTHROID  TAKE 1 TABLET BY MOUTH ONCE DAILY BEFORE BREAKFAST   losartan  50 MG tablet Commonly known as: COZAAR  Take 1 tablet (50 mg total) by mouth daily.   multivitamin-lutein Caps capsule Take 1 capsule by mouth daily.   naproxen  250 MG tablet Commonly known as: NAPROSYN  Take 1 tablet (250 mg total) by mouth 2 (two) times daily with a meal.   OSCAL  500/200 D-3 PO Take 1 capsule by mouth. Daily at lunchtime   Tavaborole 5 % Soln Apply 1 Application topically daily.   Turmeric 500 MG Caps Take 1 capsule by mouth daily.   Vitamin D 50 MCG (2000 UT) Caps Take 1 capsule by mouth daily.         Objective:   BP 127/75   Pulse 93   Ht 5' 4 (1.626 m)   Wt 150 lb (68 kg)   SpO2 97%   BMI 25.75 kg/m   Wt Readings from Last 3 Encounters:  12/18/24 150 lb (68 kg)  08/21/24 147 lb (66.7 kg)  08/05/24 144 lb (65.3 kg)    Physical Exam Vitals and nursing note reviewed.  Constitutional:      General: She is not in acute distress.    Appearance: She is well-developed. She is not diaphoretic.  Eyes:     Conjunctiva/sclera: Conjunctivae normal.  Musculoskeletal:        General: Tenderness present.     Right knee: Crepitus present. Tenderness present over the medial joint line and lateral joint line. Normal alignment and normal meniscus.     Left knee: Crepitus present. Tenderness present over the medial joint line and lateral joint line. Normal alignment and normal meniscus.  Skin:    General: Skin is warm and dry.     Findings: No rash.  Neurological:     Mental Status: She is alert and oriented to person, place, and time.     Coordination: Coordination normal.  Psychiatric:        Behavior: Behavior normal.    Physical Exam   SKIN: Skin clean, pink, and red. Right arm wound open, no infection, no pus, no drainage, no redness.         Assessment & Plan:   Problem List Items Addressed This Visit   None Visit Diagnoses       Skin tear of left elbow without complication, initial encounter    -  Primary     Bilateral primary osteoarthritis of knee       Relevant Medications   naproxen  (NAPROSYN ) 250 MG tablet          Laceration of left forearm Open wound draining without infection, healing well. - Change dressing daily, monitor for infection signs. - Rinse with warm water during showers, pat dry before  dressing. - Seek immediate care if infection signs develop.  Osteoarthritis of knee Chronic osteoarthritis managed with Naprosyn , effective but with potential gastrointestinal side effects. - Continue Naprosyn  with food. - Alternate with Tylenol to reduce ulcer risk. - Monitor for gastrointestinal bleeding signs. - Refilled Naprosyn  prescription.          Follow up plan: Return if symptoms worsen or fail to improve.  Counseling provided for all of the vaccine components No orders of the defined types were placed  in this encounter.   Fonda Levins, MD [facility] 12/18/2024, 11:20 AM     "

## 2025-01-01 NOTE — Addendum Note (Signed)
 Addended by: DOMINIC FARREL MATSU on: 01/01/2025 10:34 AM   Modules accepted: Orders

## 2025-01-02 ENCOUNTER — Other Ambulatory Visit

## 2025-01-08 NOTE — Progress Notes (Signed)
 [name]                                          MRN: [id number]   01/08/2025   The VBCI Quality Team Specialist reviewed this patient medical record for the purposes of chart review for care gap closure. The following were reviewed: chart review for care gap closure-controlling blood pressure.    VBCI Quality Team

## 2025-02-10 ENCOUNTER — Ambulatory Visit: Admitting: Oncology

## 2025-02-10 ENCOUNTER — Other Ambulatory Visit

## 2025-02-19 ENCOUNTER — Ambulatory Visit: Payer: Self-pay | Admitting: Family Medicine

## 2025-02-28 ENCOUNTER — Inpatient Hospital Stay: Admitting: Oncology

## 2025-04-15 ENCOUNTER — Ambulatory Visit
